# Patient Record
Sex: Female | Born: 1960 | Race: White | Hispanic: No | Marital: Married | Smoking: Never smoker
Health system: Southern US, Community
[De-identification: ages and names within clinical notes are randomized; demographics above are authoritative.]

## PROBLEM LIST (undated history)

## (undated) DIAGNOSIS — T4145XA Adverse effect of unspecified anesthetic, initial encounter: Secondary | ICD-10-CM

## (undated) DIAGNOSIS — Q459 Congenital malformation of digestive system, unspecified: Secondary | ICD-10-CM

## (undated) DIAGNOSIS — F4310 Post-traumatic stress disorder, unspecified: Secondary | ICD-10-CM

## (undated) DIAGNOSIS — C801 Malignant (primary) neoplasm, unspecified: Secondary | ICD-10-CM

## (undated) DIAGNOSIS — G8929 Other chronic pain: Secondary | ICD-10-CM

## (undated) DIAGNOSIS — N390 Urinary tract infection, site not specified: Secondary | ICD-10-CM

## (undated) DIAGNOSIS — K219 Gastro-esophageal reflux disease without esophagitis: Secondary | ICD-10-CM

## (undated) DIAGNOSIS — S129XXA Fracture of neck, unspecified, initial encounter: Secondary | ICD-10-CM

## (undated) DIAGNOSIS — F419 Anxiety disorder, unspecified: Secondary | ICD-10-CM

## (undated) DIAGNOSIS — K5900 Constipation, unspecified: Secondary | ICD-10-CM

## (undated) DIAGNOSIS — Z9884 Bariatric surgery status: Secondary | ICD-10-CM

## (undated) DIAGNOSIS — M1990 Unspecified osteoarthritis, unspecified site: Secondary | ICD-10-CM

## (undated) DIAGNOSIS — Z9889 Other specified postprocedural states: Secondary | ICD-10-CM

## (undated) DIAGNOSIS — J329 Chronic sinusitis, unspecified: Secondary | ICD-10-CM

## (undated) DIAGNOSIS — R112 Nausea with vomiting, unspecified: Secondary | ICD-10-CM

## (undated) DIAGNOSIS — J189 Pneumonia, unspecified organism: Secondary | ICD-10-CM

## (undated) DIAGNOSIS — N189 Chronic kidney disease, unspecified: Secondary | ICD-10-CM

## (undated) DIAGNOSIS — M797 Fibromyalgia: Secondary | ICD-10-CM

## (undated) DIAGNOSIS — T8859XA Other complications of anesthesia, initial encounter: Secondary | ICD-10-CM

## (undated) DIAGNOSIS — D649 Anemia, unspecified: Secondary | ICD-10-CM

## (undated) HISTORY — PX: ABDOMINAL HYSTERECTOMY: SHX81

---
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
## 1997-04-03 HISTORY — PX: ANKLE ARTHROSCOPY: SUR85

## 1998-04-03 HISTORY — PX: SINUS EXPLORATION: SHX5214

## 2000-06-14 ENCOUNTER — Encounter: Admission: RE | Admit: 2000-06-14 | Discharge: 2000-06-14 | Payer: Self-pay | Admitting: Otolaryngology

## 2000-06-14 ENCOUNTER — Encounter: Payer: Self-pay | Admitting: Otolaryngology

## 2001-05-09 ENCOUNTER — Other Ambulatory Visit: Admission: RE | Admit: 2001-05-09 | Discharge: 2001-05-09 | Payer: Self-pay | Admitting: Obstetrics & Gynecology

## 2002-07-04 ENCOUNTER — Other Ambulatory Visit: Admission: RE | Admit: 2002-07-04 | Discharge: 2002-07-04 | Payer: Self-pay | Admitting: Obstetrics & Gynecology

## 2004-02-22 ENCOUNTER — Other Ambulatory Visit: Admission: RE | Admit: 2004-02-22 | Discharge: 2004-02-22 | Payer: Self-pay | Admitting: Obstetrics & Gynecology

## 2004-04-03 DIAGNOSIS — Z9884 Bariatric surgery status: Secondary | ICD-10-CM

## 2004-04-03 HISTORY — DX: Bariatric surgery status: Z98.84

## 2004-04-03 HISTORY — PX: OTHER SURGICAL HISTORY: SHX169

## 2004-05-16 ENCOUNTER — Ambulatory Visit: Payer: Self-pay | Admitting: Cardiology

## 2004-05-25 ENCOUNTER — Ambulatory Visit: Payer: Self-pay

## 2004-06-03 ENCOUNTER — Ambulatory Visit: Payer: Self-pay

## 2004-06-06 ENCOUNTER — Ambulatory Visit: Payer: Self-pay | Admitting: Cardiology

## 2005-04-03 HISTORY — PX: APPENDECTOMY: SHX54

## 2005-04-03 HISTORY — PX: CHOLECYSTECTOMY: SHX55

## 2006-10-24 ENCOUNTER — Inpatient Hospital Stay (HOSPITAL_COMMUNITY): Admission: AD | Admit: 2006-10-24 | Discharge: 2006-10-24 | Payer: Self-pay | Admitting: Obstetrics and Gynecology

## 2006-10-24 ENCOUNTER — Encounter (INDEPENDENT_AMBULATORY_CARE_PROVIDER_SITE_OTHER): Payer: Self-pay | Admitting: Obstetrics & Gynecology

## 2006-10-24 ENCOUNTER — Ambulatory Visit (HOSPITAL_COMMUNITY): Admission: RE | Admit: 2006-10-24 | Discharge: 2006-10-24 | Payer: Self-pay | Admitting: Obstetrics & Gynecology

## 2008-04-03 DIAGNOSIS — Q459 Congenital malformation of digestive system, unspecified: Secondary | ICD-10-CM

## 2008-04-03 HISTORY — PX: OTHER SURGICAL HISTORY: SHX169

## 2008-04-03 HISTORY — DX: Congenital malformation of digestive system, unspecified: Q45.9

## 2009-04-03 DIAGNOSIS — S129XXA Fracture of neck, unspecified, initial encounter: Secondary | ICD-10-CM

## 2009-04-03 HISTORY — DX: Fracture of neck, unspecified, initial encounter: S12.9XXA

## 2010-08-16 NOTE — Op Note (Signed)
NAME:  Janet Potts, Janet Potts                ACCOUNT NO.:  [id number]   MEDICAL RECORD NO.:  [id number]          PATIENT TYPE:  AMB   LOCATION:  SDC                           FACILITY:  [facility]   PHYSICIAN:  Freddy Finner, M.D.   DATE OF BIRTH:  [birth year]   DATE OF PROCEDURE:  10/24/2006  DATE OF DISCHARGE:                               OPERATIVE REPORT   PREOPERATIVE DIAGNOSES:  1. Complex right ovarian cyst.  2. Simple left ovarian cyst.  3. History of chronic pelvic pain.   POSTOPERATIVE DIAGNOSIS:  Simple-appearing multiple follicular type  cysts of both ovaries.   OPERATION PERFORMED:  1. Laparoscopy.  2. Bilateral salpingo-oophorectomy.   SURGEON:  Freddy Finner, M.D.   ESTIMATED BLOOD LOSS:  The estimated intraoperative blood loss was 50  mL.   ANESTHESIA:  The anesthesia was general endotracheal.   COMPLICATIONS:  Intraoperative complications were ultimately none;  however, with confusing early insufflation of the preperitoneal space,  which confused the operative findings initially, but this was eventually  resolved.   ADDITIONAL COMPLICATIONS:  Other intraoperative complications were none.   INDICATIONS FOR THE SURGERY:  The patient is a 50 year old with a long  history of chronic pelvic pain who has been followed for a complex right  adnexal cystic mass, possibly consistent with endometrioma.  She has  elected to proceed with bilateral salpingo-oophorectomy.  She has been  able to tolerate hormone replacement therapy.   DESCRIPTION OF THE OPERATION:  The patient was admitted on the morning  of surgery.  She was brought to the operating room and placed  under  adequate general endotracheal anesthesia. The patient was placed in the  dorsal lithotomy position using the [facility] stirrup system.  Betadine prep  of the abdomen was carried out in the usual fashion.  The  bladder was  evacuated with a sterile catheter using a sterile technique.   Sponge forceps with the sponge  at the tip was left in the vaginal canal  for potential use during the procedure for anatomical identification and  traction.  An infraumbilical skin incision was made and through this an  11 mm bladder disposable trocar was placed.   Direct inspection revealed what was thought to initially be adequate  placement within the peritoneal cavity with numerous adhesions, but with  no evidence of bowel injury or other injury on entry.  A  pneumoperitoneum or what was thought to be a pneumoperitoneum was  allowed to accumulate.  Repeated attempts to visualize the pelvic  contents were unsuccessful even though the vaginal distention with the  sponge forceps was easily seen through a transitional layer thought to  be adhesions.   At this point after consultation with the husband it was elected to  terminate the procedure and discuss a laparotomy at a future date with  his wife.   Upon removal of the trocars the pneumoperitoneum did not disperse.  Replacement of the trocar actually then entered the peritoneal cavity  and it was obvious that the initial gas insufflation was in the  appropriate __________  space.  This allowed  insufflation of the  abdominal cavity, which revealed essentially no pelvic adhesions.  Scanning inspection of the upper abdomen revealed no advanced  abnormalities.  This allowed me to proceed with the bilateral salpingo-  oophorectomy as initially planned.  This was done using a 11 mm trocar  through a suprapubic incision, using grasping forceps to elevate the  tubes and ovaries, and a blunt probe and allow the Nezhat system through  this lower trocar sleeve.  The infundibulopelvic ligament and remaining  attachments to the ovary and round ligament to the lateral pelvic  sidewall were progressively sealed and divided using the [facility] tripolar  device.   The ovary was morcellated when it was observed to be completely benign  in external appearance and clear fluid filled  from essentially the site  that was entered.  Using large grasping forceps, the claw, through the  11 mm lower port the right ovary was easily extracted through the  incision by pulling the ovary into the trocar and removing the trocar.  The trocar was then replaced.  The left ovary was treated essentially  identically and retrieved in an essentially identical way.   The Nezhat system was then used to irrigate the pelvis and hemostasis  was noted to be complete.  Irritating solution was aspirated from the  pelvis.  Inspection with the reduced intra-abdominal pressure revealed  adequate hemostasis.  The instruments were removed.  The incisions were  closed and anesthetized with Vicryl using a quarter percent Marcaine.  Deep sutures of 3-0 Dexon were used to closed fascial incision at the  umbilicus and the smaller one in the suprapubic area was not closed.  The skin incisions were then closed with interrupted subcuticular  sutures of 3-0 Dexon.  Steri-strips were applied to the lower incision.   The patient was awakened and taken to recovery in good condition.   DISCHARGE INSTRUCTIONS:  The patient will be given routine outpatient  surgical instructions.      Freddy Finner, M.D.  Electronically Signed     WRN/MEDQ  D:  10/24/2006  T:  10/25/2006  Job:  [id number]

## 2011-01-16 LAB — CBC
HCT: 42
Hemoglobin: 14.3
MCHC: 34.1
MCV: 91.1
Platelets: 311
RBC: 4.61
RDW: 13.7
WBC: 11.7 — ABNORMAL HIGH

## 2011-01-16 LAB — PROTIME-INR
INR: 0.9
Prothrombin Time: 12.4

## 2011-01-16 LAB — APTT: aPTT: 28

## 2012-04-01 ENCOUNTER — Other Ambulatory Visit: Payer: Self-pay | Admitting: Neurosurgery

## 2012-04-01 DIAGNOSIS — M47812 Spondylosis without myelopathy or radiculopathy, cervical region: Secondary | ICD-10-CM

## 2012-04-02 ENCOUNTER — Ambulatory Visit
Admission: RE | Admit: 2012-04-02 | Discharge: 2012-04-02 | Disposition: A | Discharge status: Home / Self Care | Payer: 59 | Source: Ambulatory Visit | Attending: Neurosurgery | Admitting: Neurosurgery

## 2012-04-02 DIAGNOSIS — M47812 Spondylosis without myelopathy or radiculopathy, cervical region: Secondary | ICD-10-CM

## 2012-04-03 HISTORY — PX: ANKLE SURGERY: SHX546

## 2012-04-25 ENCOUNTER — Other Ambulatory Visit: Payer: Self-pay | Admitting: Neurosurgery

## 2012-05-24 ENCOUNTER — Encounter (HOSPITAL_COMMUNITY): Payer: Self-pay | Admitting: Respiratory Therapy

## 2012-05-24 ENCOUNTER — Encounter (HOSPITAL_COMMUNITY)
Admission: RE | Admit: 2012-05-24 | Discharge: 2012-05-24 | Disposition: A | Discharge status: Home / Self Care | Payer: 59 | Source: Ambulatory Visit | Attending: Neurosurgery | Admitting: Neurosurgery

## 2012-05-24 ENCOUNTER — Encounter (HOSPITAL_COMMUNITY): Payer: Self-pay

## 2012-05-24 HISTORY — DX: Other complications of anesthesia, initial encounter: T88.59XA

## 2012-05-24 HISTORY — DX: Pneumonia, unspecified organism: J18.9

## 2012-05-24 HISTORY — DX: Other specified postprocedural states: Z98.890

## 2012-05-24 HISTORY — DX: Adverse effect of unspecified anesthetic, initial encounter: T41.45XA

## 2012-05-24 HISTORY — DX: Urinary tract infection, site not specified: N39.0

## 2012-05-24 HISTORY — DX: Fibromyalgia: M79.7

## 2012-05-24 HISTORY — DX: Congenital malformation of digestive system, unspecified: Q45.9

## 2012-05-24 HISTORY — DX: Anxiety disorder, unspecified: F41.9

## 2012-05-24 HISTORY — DX: Other specified postprocedural states: R11.2

## 2012-05-24 HISTORY — DX: Gastro-esophageal reflux disease without esophagitis: K21.9

## 2012-05-24 HISTORY — DX: Malignant (primary) neoplasm, unspecified: C80.1

## 2012-05-24 HISTORY — DX: Bariatric surgery status: Z98.84

## 2012-05-24 LAB — BASIC METABOLIC PANEL
CO2: 29 mEq/L (ref 19–32)
Calcium: 9.8 mg/dL (ref 8.4–10.5)
Potassium: 4.1 mEq/L (ref 3.5–5.1)
Sodium: 139 mEq/L (ref 135–145)

## 2012-05-24 LAB — SURGICAL PCR SCREEN: Staphylococcus aureus: NEGATIVE

## 2012-05-24 NOTE — Pre-Procedure Instructions (Addendum)
Janet Potts  05/24/2012   Your procedure is scheduled on:05/31/12  Report to [facility] at530 AM.  Call this number if you have problems the morning of surgery: [phone]   Remember:   Do not eat food or drink liquids after midnight.   Take these medicines the morning of surgery with A SIP OF WATER: pain med , zofran, xanax  STOP ibuprofen   Do not wear jewelry, make-up or nail polish.  Do not wear lotions, powders, or perfumes. You may wear deodorant.  Do not shave 48 hours prior to surgery. Men may shave face and neck.  Do not bring valuables to the hospital.  Contacts, dentures or bridgework may not be worn into surgery.  Leave suitcase in the car. After surgery it may be brought to your room.  For patients admitted to the hospital, checkout time is 11:00 AM the day of  discharge.   Patients discharged the day of surgery will not be allowed to drive  home.  Name and phone number of your driver:  Special Instructions: Shower using CHG 2 nights before surgery and the night before surgery.  If you shower the day of surgery use CHG.  Use special wash - you have one bottle of CHG for all showers.  You should use approximately 1/3 of the bottle for each shower.   Please read over the following fact sheets that you were given: Pain Booklet, Coughing and Deep Breathing, MRSA Information and Surgical Site Infection Prevention

## 2012-05-24 NOTE — Progress Notes (Signed)
Patient very anxious. Past hx of tramatic surgeries. 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

## 2012-05-27 NOTE — H&P (Signed)
Was unable to locate stress test or echo from [facility] OR [facility].

## 2012-05-30 MED ORDER — CEFAZOLIN SODIUM-DEXTROSE 2-3 GM-% IV SOLR
2.0000 g | INTRAVENOUS | Status: AC
  Administered 2012-05-31: 2 g via INTRAVENOUS
  Filled 2012-05-30: qty 50

## 2012-05-31 ENCOUNTER — Ambulatory Visit (HOSPITAL_COMMUNITY): Payer: 59

## 2012-05-31 ENCOUNTER — Inpatient Hospital Stay (HOSPITAL_COMMUNITY)
Admission: RE | Admit: 2012-05-31 | Discharge: 2012-05-31 | DRG: 490 | Disposition: A | Discharge status: Home / Self Care | Payer: 59 | Source: Ambulatory Visit | Attending: Neurosurgery | Admitting: Neurosurgery

## 2012-05-31 ENCOUNTER — Encounter (HOSPITAL_COMMUNITY): Payer: Self-pay | Admitting: Anesthesiology

## 2012-05-31 ENCOUNTER — Encounter (HOSPITAL_COMMUNITY): Payer: Self-pay | Admitting: *Deleted

## 2012-05-31 ENCOUNTER — Ambulatory Visit (HOSPITAL_COMMUNITY): Payer: 59 | Admitting: Anesthesiology

## 2012-05-31 ENCOUNTER — Encounter (HOSPITAL_COMMUNITY)
Admission: RE | Disposition: A | Discharge status: Home / Self Care | Payer: Self-pay | Source: Ambulatory Visit | Attending: Neurosurgery

## 2012-05-31 DIAGNOSIS — Z9884 Bariatric surgery status: Secondary | ICD-10-CM

## 2012-05-31 DIAGNOSIS — Z01812 Encounter for preprocedural laboratory examination: Secondary | ICD-10-CM

## 2012-05-31 DIAGNOSIS — Z85828 Personal history of other malignant neoplasm of skin: Secondary | ICD-10-CM

## 2012-05-31 DIAGNOSIS — F411 Generalized anxiety disorder: Secondary | ICD-10-CM | POA: Diagnosis present

## 2012-05-31 DIAGNOSIS — Z79899 Other long term (current) drug therapy: Secondary | ICD-10-CM

## 2012-05-31 DIAGNOSIS — M5000 Cervical disc disorder with myelopathy, unspecified cervical region: Principal | ICD-10-CM | POA: Diagnosis present

## 2012-05-31 DIAGNOSIS — M5020 Other cervical disc displacement, unspecified cervical region: Secondary | ICD-10-CM | POA: Diagnosis present

## 2012-05-31 HISTORY — PX: CERVICAL DISC ARTHROPLASTY: SHX587

## 2012-05-31 LAB — CBC WITH DIFFERENTIAL/PLATELET
Eosinophils Relative: 1 % (ref 0–5)
Hemoglobin: 13 g/dL (ref 12.0–15.0)
Lymphocytes Relative: 37 % (ref 12–46)
Lymphs Abs: 2.7 10*3/uL (ref 0.7–4.0)
MCV: 89.8 fL (ref 78.0–100.0)
Neutrophils Relative %: 51 % (ref 43–77)
Platelets: 243 10*3/uL (ref 150–400)
RBC: 4.21 MIL/uL (ref 3.87–5.11)
WBC: 7.2 10*3/uL (ref 4.0–10.5)

## 2012-05-31 SURGERY — CERVICAL ANTERIOR DISC ARTHROPLASTY
Anesthesia: General | Site: Spine Cervical | Wound class: Clean

## 2012-05-31 MED ORDER — EST ESTROGENS-METHYLTEST 1.25-2.5 MG PO TABS: 1.0000 | ORAL_TABLET | Freq: Every day | ORAL | Status: DC

## 2012-05-31 MED ORDER — SODIUM CHLORIDE 0.9 % IJ SOLN: 3.0000 mL | INTRAMUSCULAR | Status: DC | PRN

## 2012-05-31 MED ORDER — HYDROMORPHONE HCL PF 1 MG/ML IJ SOLN
INTRAMUSCULAR | Status: AC
  Filled 2012-05-31: qty 1

## 2012-05-31 MED ORDER — CEFAZOLIN SODIUM 1-5 GM-% IV SOLN
1.0000 g | Freq: Three times a day (TID) | INTRAVENOUS | Status: DC
  Administered 2012-05-31: 1 g via INTRAVENOUS
  Filled 2012-05-31 (×2): qty 50

## 2012-05-31 MED ORDER — HYDROCODONE-ACETAMINOPHEN 5-325 MG PO TABS: 1.0000 | ORAL_TABLET | ORAL | Status: DC | PRN

## 2012-05-31 MED ORDER — KETOROLAC TROMETHAMINE 30 MG/ML IJ SOLN
30.0000 mg | Freq: Four times a day (QID) | INTRAMUSCULAR | Status: DC
  Administered 2012-05-31: 30 mg via INTRAVENOUS

## 2012-05-31 MED ORDER — NEOSTIGMINE METHYLSULFATE 1 MG/ML IJ SOLN
INTRAMUSCULAR | Status: DC | PRN
  Administered 2012-05-31: 3 mg via INTRAVENOUS

## 2012-05-31 MED ORDER — ROCURONIUM BROMIDE 100 MG/10ML IV SOLN
INTRAVENOUS | Status: DC | PRN
  Administered 2012-05-31: 50 mg via INTRAVENOUS

## 2012-05-31 MED ORDER — HEMOSTATIC AGENTS (NO CHARGE) OPTIME
TOPICAL | Status: DC | PRN
  Administered 2012-05-31: 1 via TOPICAL

## 2012-05-31 MED ORDER — OXYCODONE-ACETAMINOPHEN 5-325 MG PO TABS
1.0000 | ORAL_TABLET | ORAL | Status: DC | PRN
  Administered 2012-05-31 (×2): 2 via ORAL
  Filled 2012-05-31 (×2): qty 2

## 2012-05-31 MED ORDER — CYCLOBENZAPRINE HCL 10 MG PO TABS
10.0000 mg | ORAL_TABLET | Freq: Three times a day (TID) | ORAL | Status: DC | PRN
  Administered 2012-05-31: 10 mg via ORAL

## 2012-05-31 MED ORDER — ZOLPIDEM TARTRATE 5 MG PO TABS: 5.0000 mg | ORAL_TABLET | Freq: Every evening | ORAL | Status: DC | PRN

## 2012-05-31 MED ORDER — LIDOCAINE HCL (CARDIAC) 20 MG/ML IV SOLN
INTRAVENOUS | Status: DC | PRN
  Administered 2012-05-31: 80 mg via INTRAVENOUS

## 2012-05-31 MED ORDER — DEXAMETHASONE SODIUM PHOSPHATE 10 MG/ML IJ SOLN: 10.0000 mg | INTRAMUSCULAR | Status: DC

## 2012-05-31 MED ORDER — DEXAMETHASONE SODIUM PHOSPHATE 10 MG/ML IJ SOLN
INTRAMUSCULAR | Status: AC
  Administered 2012-05-31: 10 mg via INTRAVENOUS
  Filled 2012-05-31: qty 1

## 2012-05-31 MED ORDER — SODIUM CHLORIDE 0.9 % IJ SOLN
3.0000 mL | Freq: Two times a day (BID) | INTRAMUSCULAR | Status: DC
  Administered 2012-05-31: 3 mL via INTRAVENOUS

## 2012-05-31 MED ORDER — ALUM & MAG HYDROXIDE-SIMETH 200-200-20 MG/5ML PO SUSP: 30.0000 mL | Freq: Four times a day (QID) | ORAL | Status: DC | PRN

## 2012-05-31 MED ORDER — ALPRAZOLAM 0.5 MG PO TABS: 0.5000 mg | ORAL_TABLET | Freq: Every day | ORAL | Status: DC | PRN

## 2012-05-31 MED ORDER — SODIUM CHLORIDE 0.9 % IV SOLN
INTRAVENOUS | Status: AC
  Filled 2012-05-31: qty 500

## 2012-05-31 MED ORDER — HYDROMORPHONE HCL PF 1 MG/ML IJ SOLN
0.2500 mg | INTRAMUSCULAR | Status: DC | PRN
  Administered 2012-05-31 (×4): 0.5 mg via INTRAVENOUS

## 2012-05-31 MED ORDER — THROMBIN 5000 UNITS EX SOLR
CUTANEOUS | Status: DC | PRN
  Administered 2012-05-31 (×2): 5000 [IU] via TOPICAL

## 2012-05-31 MED ORDER — ACETAMINOPHEN 650 MG RE SUPP: 650.0000 mg | RECTAL | Status: DC | PRN

## 2012-05-31 MED ORDER — GLYCOPYRROLATE 0.2 MG/ML IJ SOLN
INTRAMUSCULAR | Status: DC | PRN
  Administered 2012-05-31: 0.4 mg via INTRAVENOUS

## 2012-05-31 MED ORDER — SODIUM CHLORIDE 0.9 % IR SOLN
Status: DC | PRN
  Administered 2012-05-31: 08:00:00

## 2012-05-31 MED ORDER — 0.9 % SODIUM CHLORIDE (POUR BTL) OPTIME
TOPICAL | Status: DC | PRN
  Administered 2012-05-31: 1000 mL

## 2012-05-31 MED ORDER — MENTHOL 3 MG MT LOZG
1.0000 | LOZENGE | OROMUCOSAL | Status: DC | PRN
  Filled 2012-05-31: qty 9

## 2012-05-31 MED ORDER — LACTATED RINGERS IV SOLN
INTRAVENOUS | Status: DC | PRN
  Administered 2012-05-31 (×2): via INTRAVENOUS

## 2012-05-31 MED ORDER — ONDANSETRON HCL 4 MG/2ML IJ SOLN: 4.0000 mg | INTRAMUSCULAR | Status: DC | PRN

## 2012-05-31 MED ORDER — ONDANSETRON HCL 4 MG/2ML IJ SOLN
INTRAMUSCULAR | Status: DC | PRN
  Administered 2012-05-31: 4 mg via INTRAVENOUS

## 2012-05-31 MED ORDER — KETOROLAC TROMETHAMINE 30 MG/ML IJ SOLN: 30.0000 mg | Freq: Once | INTRAMUSCULAR | Status: DC

## 2012-05-31 MED ORDER — HYDROMORPHONE HCL PF 1 MG/ML IJ SOLN
0.5000 mg | INTRAMUSCULAR | Status: DC | PRN
  Administered 2012-05-31: 1 mg via INTRAVENOUS
  Filled 2012-05-31: qty 1

## 2012-05-31 MED ORDER — SENNA 8.6 MG PO TABS
1.0000 | ORAL_TABLET | Freq: Two times a day (BID) | ORAL | Status: DC
  Administered 2012-05-31: 8.6 mg via ORAL

## 2012-05-31 MED ORDER — CYCLOBENZAPRINE HCL 10 MG PO TABS
ORAL_TABLET | ORAL | Status: AC
  Filled 2012-05-31: qty 1

## 2012-05-31 MED ORDER — FENTANYL CITRATE 0.05 MG/ML IJ SOLN
INTRAMUSCULAR | Status: DC | PRN
  Administered 2012-05-31 (×4): 100 ug via INTRAVENOUS
  Administered 2012-05-31: 50 ug via INTRAVENOUS

## 2012-05-31 MED ORDER — ORPHENADRINE CITRATE ER 100 MG PO TB12
100.0000 mg | ORAL_TABLET | Freq: Two times a day (BID) | ORAL | Status: DC | PRN
  Filled 2012-05-31: qty 1

## 2012-05-31 MED ORDER — ACETAMINOPHEN 325 MG PO TABS: 650.0000 mg | ORAL_TABLET | ORAL | Status: DC | PRN

## 2012-05-31 MED ORDER — OXYCODONE-ACETAMINOPHEN 5-325 MG PO TABS: 1.0000 | ORAL_TABLET | ORAL | Status: DC | PRN

## 2012-05-31 MED ORDER — PROPOFOL 10 MG/ML IV BOLUS
INTRAVENOUS | Status: DC | PRN
  Administered 2012-05-31: 170 mg via INTRAVENOUS

## 2012-05-31 MED ORDER — MIDAZOLAM HCL 5 MG/5ML IJ SOLN
INTRAMUSCULAR | Status: DC | PRN
  Administered 2012-05-31: 2 mg via INTRAVENOUS

## 2012-05-31 MED ORDER — BACITRACIN 50000 UNITS IM SOLR
INTRAMUSCULAR | Status: AC
  Filled 2012-05-31: qty 1

## 2012-05-31 MED ORDER — KETOROLAC TROMETHAMINE 30 MG/ML IJ SOLN
INTRAMUSCULAR | Status: AC
  Filled 2012-05-31: qty 1

## 2012-05-31 MED ORDER — PHENOL 1.4 % MT LIQD: 1.0000 | OROMUCOSAL | Status: DC | PRN

## 2012-05-31 SURGICAL SUPPLY — 52 items
APL SKNCLS STERI-STRIP NONHPOA (GAUZE/BANDAGES/DRESSINGS) ×1
BAG DECANTER FOR FLEXI CONT (MISCELLANEOUS) ×2 IMPLANT
BENZOIN TINCTURE PRP APPL 2/3 (GAUZE/BANDAGES/DRESSINGS) ×2 IMPLANT
BRUSH SCRUB EZ PLAIN DRY (MISCELLANEOUS) ×2 IMPLANT
BUR MATCHSTICK NEURO 3.0 LAGG (BURR) ×2 IMPLANT
CANISTER SUCTION 2500CC (MISCELLANEOUS) ×2 IMPLANT
CLOTH BEACON ORANGE TIMEOUT ST (SAFETY) ×2 IMPLANT
CONT SPEC 4OZ CLIKSEAL STRL BL (MISCELLANEOUS) ×2 IMPLANT
CUTTER 14MM (INSTRUMENTS) ×1 IMPLANT
DISC CERVICAL BRYAN 14MM (Neuro Prosthesis/Implant) ×1 IMPLANT
DRAPE C-ARM 42X72 X-RAY (DRAPES) ×4 IMPLANT
DRAPE LAPAROTOMY 100X72 PEDS (DRAPES) ×2 IMPLANT
DRAPE MICROSCOPE ZEISS OPMI (DRAPES) ×2 IMPLANT
DRAPE POUCH INSTRU U-SHP 10X18 (DRAPES) ×2 IMPLANT
ELECT COATED BLADE 2.86 ST (ELECTRODE) ×2 IMPLANT
ELECT REM PT RETURN 9FT ADLT (ELECTROSURGICAL) ×2
ELECTRODE REM PT RTRN 9FT ADLT (ELECTROSURGICAL) ×1 IMPLANT
GAUZE SPONGE 4X4 16PLY XRAY LF (GAUZE/BANDAGES/DRESSINGS) IMPLANT
GLOVE BIOGEL PI IND STRL 7.0 (GLOVE) IMPLANT
GLOVE BIOGEL PI IND STRL 8 (GLOVE) IMPLANT
GLOVE BIOGEL PI INDICATOR 7.0 (GLOVE) ×1
GLOVE BIOGEL PI INDICATOR 8 (GLOVE) ×1
GLOVE ECLIPSE 8.5 STRL (GLOVE) ×2 IMPLANT
GLOVE EXAM NITRILE LRG STRL (GLOVE) IMPLANT
GLOVE EXAM NITRILE MD LF STRL (GLOVE) IMPLANT
GLOVE EXAM NITRILE XL STR (GLOVE) IMPLANT
GLOVE EXAM NITRILE XS STR PU (GLOVE) IMPLANT
GLOVE SURG SS PI 7.0 STRL IVOR (GLOVE) ×5 IMPLANT
GOWN BRE IMP SLV AUR LG STRL (GOWN DISPOSABLE) IMPLANT
GOWN BRE IMP SLV AUR XL STRL (GOWN DISPOSABLE) IMPLANT
GOWN STRL REIN 2XL LVL4 (GOWN DISPOSABLE) IMPLANT
HEAD HALTER (SOFTGOODS) ×2 IMPLANT
KIT BASIN OR (CUSTOM PROCEDURE TRAY) ×2 IMPLANT
KIT ROOM TURNOVER OR (KITS) ×2 IMPLANT
NDL SPNL 20GX3.5 QUINCKE YW (NEEDLE) ×1 IMPLANT
NEEDLE SPNL 20GX3.5 QUINCKE YW (NEEDLE) ×2 IMPLANT
NS IRRIG 1000ML POUR BTL (IV SOLUTION) ×2 IMPLANT
PACK LAMINECTOMY NEURO (CUSTOM PROCEDURE TRAY) ×2 IMPLANT
PAD ARMBOARD 7.5X6 YLW CONV (MISCELLANEOUS) ×6 IMPLANT
RUBBERBAND STERILE (MISCELLANEOUS) ×4 IMPLANT
SPONGE GAUZE 4X4 12PLY (GAUZE/BANDAGES/DRESSINGS) ×2 IMPLANT
SPONGE INTESTINAL PEANUT (DISPOSABLE) ×2 IMPLANT
SPONGE SURGIFOAM ABS GEL SZ50 (HEMOSTASIS) ×2 IMPLANT
STRIP CLOSURE SKIN 1/2X4 (GAUZE/BANDAGES/DRESSINGS) ×2 IMPLANT
SUT PDS AB 5-0 P3 18 (SUTURE) ×2 IMPLANT
SUT VIC AB 3-0 SH 8-18 (SUTURE) ×2 IMPLANT
SYR 20ML ECCENTRIC (SYRINGE) ×2 IMPLANT
TAPE CLOTH 4X10 WHT NS (GAUZE/BANDAGES/DRESSINGS) ×2 IMPLANT
TAPE STRIPS DRAPE STRL (GAUZE/BANDAGES/DRESSINGS) ×1 IMPLANT
TOWEL OR 17X24 6PK STRL BLUE (TOWEL DISPOSABLE) ×2 IMPLANT
TOWEL OR 17X26 10 PK STRL BLUE (TOWEL DISPOSABLE) ×2 IMPLANT
WATER STERILE IRR 1000ML POUR (IV SOLUTION) ×2 IMPLANT

## 2012-05-31 NOTE — Op Note (Signed)
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  Asst. Surgeon: Jeral Fruit  Anesthesia: General  Indication: 52 year old female with chronic neck pain headache and some radiation into her shoulders failing all conservative management presents with a fairly large paracentral disc herniation and some associated spondylosis at C5-6 causing spinal cord compression. Remainder of her cervical spine is normal. I discussed options of treatment including both operative and of care. Patient said proceed with C4-5 anterior cervical discectomy with interbody arthroplasty in hopes of improving her symptoms.  Operative note: After induction anesthesia, patient positioned supine with Extended and held in place with halter traction. Anterior cervical region prepped and draped. Incision overlying the C4-5 interspace. This carried down sharply to the platysma. Platysma divided vertically and dissection proceeded along the medial border of the sternomastoid muscle and carotid sheath. Trachea and esophagus retracted towards the left. Prevertebral fascia stripped within her spinal column. Longus colli muscles elevated bilaterally using electrocautery. Deep self retaining retractors placed intraoperative fluoroscopy is used levels confirmed. The space and size 15 blade in a rectangular fashion. Y. disc space cleanout was achieved using pituitary rongeurs forward and backward angle Carlen curettes Kerrison rongeurs and a high-speed drill. All elements the disc removed down to level of the posterior annulus. Microscope was then brought into the field and used throughout the remainder of the discectomy. Remaining aspects of annulus and osteophytes  removed using high-speed drill down to the level of the posterior longitudinal ligament. Posterior lateral and was elevated and resected in piecemeal fashion using Kerrison rongeurs. Underlying thecal sac was identified. A wide central decompression and perform undercutting the bodies of C4 and C5. Decompression MCH are of foramen. Anterior foraminotomies were then performed on course exiting C5 nerve roots bilaterally. At this point a very thorough decompression achieved. Attention was then placed towards interbody arthroplasty. Distractor pins were placed in the vertebral bodies of C4 and C5 and orthogonal fashion. The disc spaces distracted open. It was measured and sized and a 14 mm implant was found to be the appropriate size for this patient. The space was prepared. The cutting drill was then inserted and the interspace and concavity is were then made into the vertebral bodies of C4 and C5. The 14 mm Bryan disc was then prepared and packed into place within the disc space. This countersunk well without difficulty or abnormality. Images real good position of the implant at the proper upper level with normal alignment of the spine. Wound is then irrigated one final time and then closed in a typical fashion. Steri-Strips and sterile dressing were applied. There were no apparent complications. Patient tolerated the procedure well and she returns recovery postop.

## 2012-05-31 NOTE — Anesthesia Postprocedure Evaluation (Signed)
  Anesthesia Post-op Note  Patient: Janet Potts  Procedure(s) Performed: Procedure(s) with comments: CERVICAL ANTERIOR DISC ARTHROPLASTY (N/A) - C-Arm  Patient Location: PACU  Anesthesia Type:General  Level of Consciousness: awake  Airway and Oxygen Therapy: Patient Spontanous Breathing  Post-op Pain: mild  Post-op Assessment: Post-op Vital signs reviewed  Post-op Vital Signs: Reviewed  Complications: No apparent anesthesia complications

## 2012-05-31 NOTE — Brief Op Note (Signed)
05/31/2012  8:50 AM  PATIENT:  Janet Potts  52 y.o. female  PRE-OPERATIVE DIAGNOSIS:  DDD  POST-OPERATIVE DIAGNOSIS:  degenerative dsic disease  PROCEDURE:  Procedure(s) with comments: CERVICAL ANTERIOR DISC ARTHROPLASTY (N/A) - C-Arm  SURGEON:  Surgeon(s) and Role:    * Temple Pacini, MD - Primary    * Karn Cassis, MD - Assisting  PHYSICIAN ASSISTANT:   ASSISTANTS:    ANESTHESIA:   General  EBL:  Total I/O In: 1000 [I.V.:1000] Out: -   BLOOD ADMINISTERED:none  DRAINS: none   LOCAL MEDICATIONS USED:  NONE  SPECIMEN:  No Specimen  DISPOSITION OF SPECIMEN:  N/A  COUNTS:  YES  TOURNIQUET:  * No tourniquets in log *  DICTATION: .Dragon Dictation  PLAN OF CARE: Admit to inpatient   PATIENT DISPOSITION:  PACU - hemodynamically stable.   Delay start of Pharmacological VTE agent (>24hrs) due to surgical blood loss or risk of bleeding: yes

## 2012-05-31 NOTE — Progress Notes (Signed)
Utilization review completed. Lylian Sanagustin, RN, BSN. 

## 2012-05-31 NOTE — Discharge Summary (Signed)
Physician Discharge Summary  Patient ID: Savannah Morford MRN: [id number] DOB/AGE: 1960-07-10 52 y.o.  Admit date: 05/31/2012 Discharge date: 05/31/2012  Admission Diagnoses:  Discharge Diagnoses:  Principal Problem:   Herniation of cervical intervertebral disc   Discharged Condition: good  Hospital Course: Patient in the hospital where she underwent uncomplicated anterior cervical discectomy and interbody arthroplasty. Postoperatively she did well without new problems. Ready for discharge home.  Consults:   Significant Diagnostic Studies:   Treatments:   Discharge Exam: Blood pressure 111/71, pulse 57, temperature 98.5 F (36.9 C), resp. rate 18, SpO2 93.00%. Awake and alert oriented and appropriate Percocet nerve function is intact. Motor and sensory function of the extremities is normal. Wound clean and dry. Next soft chest and abdomen benign.  Disposition: Final discharge disposition not confirmed     Medication List    TAKE these medications       ALPRAZolam 0.5 MG tablet  Commonly known as:  XANAX  Take 0.5 mg by mouth daily as needed for anxiety.     Calcium 1200-1000 MG-UNIT Chew  Chew 1 tablet by mouth daily.     CALCIUM PO  Take 1 tablet by mouth daily.     estrogen-methylTESTOSTERone 1.25-2.5 MG per tablet  Commonly known as:  ESTRATEST  Take 1 tablet by mouth daily.     GAS-X PO  Take 180 mg by mouth daily as needed.     HYDROcodone-acetaminophen 7.5-500 MG per tablet  Commonly known as:  LORTAB  Take 1 tablet by mouth every 6 (six) hours as needed for pain.     ibuprofen 200 MG tablet  Commonly known as:  ADVIL,MOTRIN  Take 400 mg by mouth every 6 (six) hours as needed for pain.     KRILL OIL OMEGA-3 PO  Take 1 tablet by mouth daily.     magnesium chloride 64 MG Tbec  Commonly known as:  SLOW-MAG  Take 1 tablet by mouth daily.     multivitamin with minerals Tabs  Take 1 tablet by mouth daily.     ondansetron 4 MG tablet  Commonly known  as:  ZOFRAN  Take 4 mg by mouth every 8 (eight) hours as needed for nausea.     orphenadrine 100 MG tablet  Commonly known as:  NORFLEX  Take 100 mg by mouth 2 (two) times daily as needed for muscle spasms.     OVER THE COUNTER MEDICATION  Take 1 tablet by mouth daily. Vita-Melts Energy Plus Vitamin b12 1500mg      oxyCODONE-acetaminophen 5-325 MG per tablet  Commonly known as:  PERCOCET/ROXICET  Take 1-2 tablets by mouth every 4 (four) hours as needed.     VITAMIN B-12 SL  Place 2,400 mcg under the tongue daily.     vitamin C 500 MG tablet  Commonly known as:  ASCORBIC ACID  Take 1,000 mg by mouth daily.           Follow-up Information   Follow up with Zyier Dykema A, MD. Call in 1 week. (Ask for Lurena Joiner)    Contact information:   [address] [place] [address] [phone]       Signed: Temple Pacini 05/31/2012, 6:58 PM

## 2012-05-31 NOTE — Anesthesia Procedure Notes (Signed)
Procedure Name: Intubation Date/Time: 05/31/2012 7:09 AM Performed by: Ferol Luz L Pre-anesthesia Checklist: Patient identified, Emergency Drugs available, Suction available, Patient being monitored and Timeout performed Patient Re-evaluated:Patient Re-evaluated prior to inductionOxygen Delivery Method: Circle system utilized Preoxygenation: Pre-oxygenation with 100% oxygen Intubation Type: IV induction and Cricoid Pressure applied Ventilation: Mask ventilation without difficulty Laryngoscope Size: Mac and 3 Grade View: Grade II Tube type: Oral Tube size: 7.5 mm Number of attempts: 1 Airway Equipment and Method: Stylet Placement Confirmation: ETT inserted through vocal cords under direct vision,  breath sounds checked- equal and bilateral and positive ETCO2 Secured at: 21 cm Tube secured with: Tape Dental Injury: Teeth and Oropharynx as per pre-operative assessment  Comments: Atraumatic neutral intubation

## 2012-05-31 NOTE — H&P (Signed)
Janet Potts is an 52 y.o. female.   Chief Complaint: Neck pain HPI: 52 year old female with history of previous trauma to the back of her neck. Patient with subsequent and long-standing neck pain with some radiation to her upper extremities and associated headaches failing all conservative management. Workup demonstrates evidence of a fairly large central disc herniation at C4-5 with compression of the spinal cord. There is also some degree of kyphotic angulation. The remainder of her cervical spine was quite healthy. Patient has a callus as her options and she is decided proceed with C4-5 anterior cervical discectomy with interbody arthroplasty in hopes of improving her symptoms.  Past Medical History  Diagnosis Date  . Complication of anesthesia   . PONV (postoperative nausea and vomiting)     occ  . 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 Gastric bypass status for obesity 06  . Anxiety     anxiety  . Pneumonia     20's  . UTI (urinary tract infection)     ? irritated  . GERD (gastroesophageal reflux disease)     hx before gastric bypass  . Cancer     skin  . Fibromyalgia     ?    Past Surgical History  Procedure Laterality Date  . Gastic  06    bypass  . Abdominal hysterectomy  98,08    partial, oophorectomy bil  . Tonsillectomy  74  . Ankle arthroscopy Right 99    cartilage  . Sinus exploration  00  . Cholecystectomy  07  . Appendectomy  07  . Gastricsurgery  10    repair intestinal rupture    History reviewed. No pertinent family history. Social History:  reports that she has never smoked. She does not have any smokeless tobacco history on file. She reports that she drinks about 1.1 ounces of alcohol per week. She reports that she does not use illicit drugs.  Allergies:  Allergies  Allergen Reactions  . Other     Silk tape  . Sulfa Antibiotics Swelling  . Adhesive (Tape) Hives, Swelling and  Rash  . Avelox (Moxifloxacin) Rash    Medications Prior to Admission  Medication Sig Dispense Refill  . Calcium 1200-1000 MG-UNIT CHEW Chew 1 tablet by mouth daily.      Marland Kitchen CALCIUM PO Take 1 tablet by mouth daily.      . Cyanocobalamin (VITAMIN B-12 SL) Place 2,400 mcg under the tongue daily.      Marland Kitchen estrogen-methylTESTOSTERone (ESTRATEST) 1.25-2.5 MG per tablet Take 1 tablet by mouth daily.      Marland Kitchen HYDROcodone-acetaminophen (LORTAB) 7.5-500 MG per tablet Take 1 tablet by mouth every 6 (six) hours as needed for pain.      Marland Kitchen KRILL OIL OMEGA-3 PO Take 1 tablet by mouth daily.      . magnesium chloride (SLOW-MAG) 64 MG TBEC Take 1 tablet by mouth daily.      . Multiple Vitamin (MULTIVITAMIN WITH MINERALS) TABS Take 1 tablet by mouth daily.      . orphenadrine (NORFLEX) 100 MG tablet Take 100 mg by mouth 2 (two) times daily as needed for muscle spasms.      Marland Kitchen OVER THE COUNTER MEDICATION Take 1 tablet by mouth daily. Vita-Melts Energy Plus Vitamin b12 1500mg       . Simethicone (GAS-X PO) Take 180 mg by mouth daily as needed.      . vitamin C (ASCORBIC ACID) 500 MG  tablet Take 1,000 mg by mouth daily.      Marland Kitchen ALPRAZolam (XANAX) 0.5 MG tablet Take 0.5 mg by mouth daily as needed for anxiety.       Marland Kitchen ibuprofen (ADVIL,MOTRIN) 200 MG tablet Take 400 mg by mouth every 6 (six) hours as needed for pain.      Marland Kitchen ondansetron (ZOFRAN) 4 MG tablet Take 4 mg by mouth every 8 (eight) hours as needed for nausea.        Results for orders placed during the hospital encounter of 05/31/12 (from the past 48 hour(s))  CBC WITH DIFFERENTIAL     Status: None   Collection Time    05/31/12  5:59 AM      Result Value Range   WBC 7.2  4.0 - 10.5 K/uL   RBC 4.21  3.87 - 5.11 MIL/uL   Hemoglobin 13.0  12.0 - 15.0 g/dL   HCT 16.1  09.6 - 04.5 %   MCV 89.8  78.0 - 100.0 fL   MCH 30.9  26.0 - 34.0 pg   MCHC 34.4  30.0 - 36.0 g/dL   RDW 40.9  81.1 - 91.4 %   Platelets 243  150 - 400 K/uL   Neutrophils Relative 51  43 -  77 %   Neutro Abs 3.7  1.7 - 7.7 K/uL   Lymphocytes Relative 37  12 - 46 %   Lymphs Abs 2.7  0.7 - 4.0 K/uL   Monocytes Relative 10  3 - 12 %   Monocytes Absolute 0.7  0.1 - 1.0 K/uL   Eosinophils Relative 1  0 - 5 %   Eosinophils Absolute 0.1  0.0 - 0.7 K/uL   Basophils Relative 1  0 - 1 %   Basophils Absolute 0.1  0.0 - 0.1 K/uL   No results found.  Review of Systems  Constitutional: Negative.   HENT: Negative.   Eyes: Negative.   Respiratory: Negative.   Cardiovascular: Negative.   Gastrointestinal: Negative.   Genitourinary: Negative.   Musculoskeletal: Negative.   Skin: Negative.   Neurological: Negative.   Endo/Heme/Allergies: Negative.   Psychiatric/Behavioral: Negative.     Blood pressure 124/89, pulse 49, temperature 98.2 F (36.8 C), resp. rate 16, SpO2 99.00%. Physical Exam  Constitutional: She is oriented to person, place, and time. She appears well-developed and well-nourished.  HENT:  Head: Normocephalic and atraumatic.  Right Ear: External ear normal.  Left Ear: External ear normal.  Nose: Nose normal.  Mouth/Throat: Oropharynx is clear and moist.  Eyes: Conjunctivae and EOM are normal. Pupils are equal, round, and reactive to light. Right eye exhibits no discharge. Left eye exhibits no discharge.  Neck: Normal range of motion. Neck supple. No tracheal deviation present. No thyromegaly present.  Cardiovascular: Normal rate, regular rhythm, normal heart sounds and intact distal pulses.  Exam reveals no friction rub.   No murmur heard. Respiratory: Effort normal and breath sounds normal. No respiratory distress. She has no wheezes.  GI: Soft. Bowel sounds are normal. She exhibits no distension. There is no tenderness.  Musculoskeletal: Normal range of motion. She exhibits no edema and no tenderness.  Neurological: She is alert and oriented to person, place, and time. She has normal reflexes. No cranial nerve deficit. Coordination normal.  Skin: Skin is warm  and dry. No rash noted. No erythema.  Psychiatric: She has a normal mood and affect. Her behavior is normal. Judgment and thought content normal.     Assessment/Plan C4-5 herniated nucleus pulposus  with myelopathy. C4-5 anterior cervical discectomy with interbody Brian disc arthroplasty.risks and benefits have been explained. Patient wishes to proceed.   Jalexis Breed A 05/31/2012, 7:23 AM

## 2012-05-31 NOTE — Plan of Care (Signed)
Problem: Consults Goal: Diagnosis - Spinal Surgery Outcome: Completed/Met Date Met:  05/31/12 Cervical Spine Fusion     

## 2012-05-31 NOTE — Anesthesia Preprocedure Evaluation (Addendum)
Anesthesia Evaluation  Patient identified by MRN, date of birth, ID band Patient awake    Reviewed: Allergy & Precautions, H&P , NPO status   History of Anesthesia Complications (+) PONV  Airway Mallampati: II      Dental   Pulmonary pneumonia -, resolved,  breath sounds clear to auscultation        Cardiovascular Exercise Tolerance: Good negative cardio ROS  Rhythm:Regular Rate:Normal     Neuro/Psych Anxiety  Neuromuscular disease    GI/Hepatic GERD-  Medicated,  Endo/Other    Renal/GU      Musculoskeletal  (+) Fibromyalgia -  Abdominal   Peds  Hematology   Anesthesia Other Findings   Reproductive/Obstetrics                          Anesthesia Physical Anesthesia Plan  ASA: II  Anesthesia Plan: General   Post-op Pain Management:    Induction: Intravenous  Airway Management Planned: Oral ETT  Additional Equipment:   Intra-op Plan:   Post-operative Plan: Extubation in OR  Informed Consent: I have reviewed the patients History and Physical, chart, labs and discussed the procedure including the risks, benefits and alternatives for the proposed anesthesia with the patient or authorized representative who has indicated his/her understanding and acceptance.   Dental advisory given  Plan Discussed with: CRNA, Anesthesiologist and Surgeon  Anesthesia Plan Comments:         Anesthesia Quick Evaluation

## 2012-05-31 NOTE — Transfer of Care (Signed)
Immediate Anesthesia Transfer of Care Note  Patient: Janet Potts  Procedure(s) Performed: Procedure(s) with comments: CERVICAL ANTERIOR DISC ARTHROPLASTY (N/A) - C-Arm  Patient Location: PACU  Anesthesia Type:General  Level of Consciousness: awake, alert , oriented and patient cooperative  Airway & Oxygen Therapy: Patient Spontanous Breathing and Patient connected to nasal cannula oxygen  Post-op Assessment: Report given to PACU RN, Post -op Vital signs reviewed and stable and Patient moving all extremities  Post vital signs: Reviewed and stable  Complications: No apparent anesthesia complications

## 2012-05-31 NOTE — Preoperative (Signed)
Beta Blockers   Reason not to administer Beta Blockers:Not Applicable 

## 2012-06-03 ENCOUNTER — Encounter (HOSPITAL_COMMUNITY): Payer: Self-pay | Admitting: Neurosurgery

## 2013-04-03 HISTORY — PX: OTHER SURGICAL HISTORY: SHX169

## 2013-07-03 NOTE — Patient Instructions (Addendum)
Janet Potts  07/03/2013   Your procedure is scheduled on:  07/15/13   Report to [facility] at    0930  AM.  Call this number if you have problems the morning of surgery: [phone]   Remember:   Do not eat food or drink liquids after midnight.   Take these medicines the morning of surgery with A SIP OF WATER:    Do not wear jewelry, make-up or nail polish.  Do not wear lotions, powders, or perfumes. You may wear deodorant.  Do not shave 48 hours prior to surgery. Men may shave face and neck.  Do not bring valuables to the hospital.  Contacts, dentures or bridgework may not be worn into surgery.  Leave suitcase in the car. After surgery it may be brought to your room.  For patients admitted to the hospital, checkout time is 11:00 AM the day of  discharge.   Patients discharged the day of surgery will not be allowed to drive  home.  Name and phone number of your driver:                   [facility] - Preparing for Surgery Before surgery, you can play an important role.  Because skin is not sterile, your skin needs to be as free of germs as possible.  You can reduce the number of germs on your skin by washing with CHG (chlorahexidine gluconate) soap before surgery.  CHG is an antiseptic cleaner which kills germs and bonds with the skin to continue killing germs even after washing. Please DO NOT use if you have an allergy to CHG or antibacterial soaps.  If your skin becomes reddened/irritated stop using the CHG and inform your nurse when you arrive at Short Stay. Do not shave (including legs and underarms) for at least 48 hours prior to the first CHG shower.  You may shave your face. Please follow these instructions carefully:  1.  Shower with CHG Soap the night before surgery and the  morning of Surgery.  2.  If you choose to wash your hair, wash your hair first as usual with your  normal  shampoo.  3.  After you shampoo, rinse your hair and body thoroughly to remove  the  shampoo.                           4.  Use CHG as you would any other liquid soap.  You can apply chg directly  to the skin and wash                       Gently with a scrungie or clean washcloth.  5.  Apply the CHG Soap to your body ONLY FROM THE NECK DOWN.   Do not use on open                           Wound or open sores. Avoid contact with eyes, ears mouth and genitals (private parts).                        Genitals (private parts) with your normal soap.             6.  Wash thoroughly, paying special attention to the area where your surgery  will be performed.  7.  Thoroughly rinse your body with  warm water from the neck down.  8.  DO NOT shower/wash with your normal soap after using and rinsing off  the CHG Soap.                9.  Pat yourself dry with a clean towel.            10.  Wear clean pajamas.            11.  Place clean sheets on your bed the night of your first shower and do not  sleep with pets. Day of Surgery : Do not apply any lotions/deodorants the morning of surgery.  Please wear clean clothes to the hospital/surgery center.  FAILURE TO FOLLOW THESE INSTRUCTIONS MAY RESULT IN THE CANCELLATION OF YOUR SURGERY PATIENT SIGNATURE_________________________________  NURSE SIGNATURE__________________________________  WHAT IS A BLOOD TRANSFUSION? Blood Transfusion Information  A transfusion is the replacement of blood or some of its parts. Blood is made up of multiple cells which provide different functions.  Red blood cells carry oxygen and are used for blood loss replacement.  White blood cells fight against infection.  Platelets control bleeding.  Plasma helps clot blood.  Other blood products are available for specialized needs, such as hemophilia or other clotting disorders. BEFORE THE TRANSFUSION  Who gives blood for transfusions?   Healthy volunteers who are fully evaluated to make sure their blood is safe. This is blood bank blood. Transfusion therapy  is the safest it has ever been in the practice of medicine. Before blood is taken from a donor, a complete history is taken to make sure that person has no history of diseases nor engages in risky social behavior (examples are intravenous drug use or sexual activity with multiple partners). The donor's travel history is screened to minimize risk of transmitting infections, such as malaria. The donated blood is tested for signs of infectious diseases, such as HIV and hepatitis. The blood is then tested to be sure it is compatible with you in order to minimize the chance of a transfusion reaction. If you or a relative donates blood, this is often done in anticipation of surgery and is not appropriate for emergency situations. It takes many days to process the donated blood. RISKS AND COMPLICATIONS Although transfusion therapy is very safe and saves many lives, the main dangers of transfusion include:   Getting an infectious disease.  Developing a transfusion reaction. This is an allergic reaction to something in the blood you were given. Every precaution is taken to prevent this. The decision to have a blood transfusion has been considered carefully by your caregiver before blood is given. Blood is not given unless the benefits outweigh the risks. AFTER THE TRANSFUSION  Right after receiving a blood transfusion, you will usually feel much better and more energetic. This is especially true if your red blood cells have gotten low (anemic). The transfusion raises the level of the red blood cells which carry oxygen, and this usually causes an energy increase.  The nurse administering the transfusion will monitor you carefully for complications. HOME CARE INSTRUCTIONS  No special instructions are needed after a transfusion. You may find your energy is better. Speak with your caregiver about any limitations on activity for underlying diseases you may have. SEEK MEDICAL CARE IF:   Your condition is not  improving after your transfusion.  You develop redness or irritation at the intravenous (IV) site. SEEK IMMEDIATE MEDICAL CARE IF:  Any of the following symptoms occur over the next  12 hours:  Shaking chills.  You have a temperature by mouth above 102 F (38.9 C), not controlled by medicine.  Chest, back, or muscle pain.  People around you feel you are not acting correctly or are confused.  Shortness of breath or difficulty breathing.  Dizziness and fainting.  You get a rash or develop hives.  You have a decrease in urine output.  Your urine turns a dark color or changes to pink, red, or brown. Any of the following symptoms occur over the next 10 days:  You have a temperature by mouth above 102 F (38.9 C), not controlled by medicine.  Shortness of breath.  Weakness after normal activity.  The white part of the eye turns yellow (jaundice).  You have a decrease in the amount of urine or are urinating less often.  Your urine turns a dark color or changes to pink, red, or brown. Document Released: 03/17/2000 Document Revised: 06/12/2011 Document Reviewed: 11/04/2007 [facility] Patient Information 2014 [place].   Incentive Spirometer  An incentive spirometer is a tool that can help keep your lungs clear and active. This tool measures how well you are filling your lungs with each breath. Taking long deep breaths may help reverse or decrease the chance of developing breathing (pulmonary) problems (especially infection) following:  A long period of time when you are unable to move or be active. BEFORE THE PROCEDURE   If the spirometer includes an indicator to show your best effort, your nurse or respiratory therapist will set it to a desired goal.  If possible, sit up straight or lean slightly forward. Try not to slouch.  Hold the incentive spirometer in an upright position. INSTRUCTIONS FOR USE  1. Sit on the edge of your bed if possible, or sit up as far as you  can in bed or on a chair. 2. Hold the incentive spirometer in an upright position. 3. Breathe out normally. 4. Place the mouthpiece in your mouth and seal your lips tightly around it. 5. Breathe in slowly and as deeply as possible, raising the piston or the ball toward the top of the column. 6. Hold your breath for 3-5 seconds or for as long as possible. Allow the piston or ball to fall to the bottom of the column. 7. Remove the mouthpiece from your mouth and breathe out normally. 8. Rest for a few seconds and repeat Steps 1 through 7 at least 10 times every 1-2 hours when you are awake. Take your time and take a few normal breaths between deep breaths. 9. The spirometer may include an indicator to show your best effort. Use the indicator as a goal to work toward during each repetition. 10. After each set of 10 deep breaths, practice coughing to be sure your lungs are clear. If you have an incision (the cut made at the time of surgery), support your incision when coughing by placing a pillow or rolled up towels firmly against it. Once you are able to get out of bed, walk around indoors and cough well. You may stop using the incentive spirometer when instructed by your caregiver.  RISKS AND COMPLICATIONS  Take your time so you do not get dizzy or light-headed.  If you are in pain, you may need to take or ask for pain medication before doing incentive spirometry. It is harder to take a deep breath if you are having pain. AFTER USE  Rest and breathe slowly and easily.  It can be helpful to keep  track of a log of your progress. Your caregiver can provide you with a simple table to help with this. If you are using the spirometer at home, follow these instructions: [place] IF:   You are having difficultly using the spirometer.  You have trouble using the spirometer as often as instructed.  Your pain medication is not giving enough relief while using the spirometer.  You develop  fever of 100.5 F (38.1 C) or higher. SEEK IMMEDIATE MEDICAL CARE IF:   You cough up bloody sputum that had not been present before.  You develop fever of 102 F (38.9 C) or greater.  You develop worsening pain at or near the incision site. MAKE SURE YOU:   Understand these instructions.  Will watch your condition.  Will get help right away if you are not doing well or get worse. Document Released: 07/31/2006 Document Revised: 06/12/2011 Document Reviewed: 10/01/2006 [facility] Patient Information 2014 [place], [place].

## 2013-07-07 ENCOUNTER — Encounter (HOSPITAL_COMMUNITY): Payer: Self-pay

## 2013-07-07 ENCOUNTER — Encounter (HOSPITAL_COMMUNITY)
Admission: RE | Admit: 2013-07-07 | Discharge: 2013-07-07 | Disposition: A | Discharge status: Home / Self Care | Payer: 59 | Source: Ambulatory Visit | Attending: Orthopedic Surgery | Admitting: Orthopedic Surgery

## 2013-07-07 ENCOUNTER — Encounter (HOSPITAL_COMMUNITY): Payer: Self-pay | Admitting: Pharmacy Technician

## 2013-07-07 ENCOUNTER — Encounter (INDEPENDENT_AMBULATORY_CARE_PROVIDER_SITE_OTHER): Payer: Self-pay

## 2013-07-07 DIAGNOSIS — Z01812 Encounter for preprocedural laboratory examination: Secondary | ICD-10-CM | POA: Insufficient documentation

## 2013-07-07 HISTORY — DX: Chronic sinusitis, unspecified: J32.9

## 2013-07-07 HISTORY — DX: Anemia, unspecified: D64.9

## 2013-07-07 HISTORY — DX: Post-traumatic stress disorder, unspecified: F43.10

## 2013-07-07 HISTORY — DX: Unspecified osteoarthritis, unspecified site: M19.90

## 2013-07-07 HISTORY — DX: Chronic kidney disease, unspecified: N18.9

## 2013-07-07 LAB — BASIC METABOLIC PANEL
BUN: 16 mg/dL (ref 6–23)
CALCIUM: 9.7 mg/dL (ref 8.4–10.5)
CO2: 29 mEq/L (ref 19–32)
Chloride: 102 mEq/L (ref 96–112)
Creatinine, Ser: 0.79 mg/dL (ref 0.50–1.10)
Glucose, Bld: 84 mg/dL (ref 70–99)
POTASSIUM: 5 meq/L (ref 3.7–5.3)
SODIUM: 142 meq/L (ref 137–147)

## 2013-07-07 LAB — APTT: APTT: 29 s (ref 24–37)

## 2013-07-07 LAB — CBC
HCT: 41.5 % (ref 36.0–46.0)
HEMOGLOBIN: 13.6 g/dL (ref 12.0–15.0)
MCH: 28.4 pg (ref 26.0–34.0)
MCHC: 32.8 g/dL (ref 30.0–36.0)
MCV: 86.6 fL (ref 78.0–100.0)
PLATELETS: 292 10*3/uL (ref 150–400)
RBC: 4.79 MIL/uL (ref 3.87–5.11)
RDW: 14.6 % (ref 11.5–15.5)
WBC: 7.5 10*3/uL (ref 4.0–10.5)

## 2013-07-07 LAB — URINALYSIS, ROUTINE W REFLEX MICROSCOPIC
BILIRUBIN URINE: NEGATIVE
Glucose, UA: NEGATIVE mg/dL
HGB URINE DIPSTICK: NEGATIVE
KETONES UR: NEGATIVE mg/dL
Leukocytes, UA: NEGATIVE
NITRITE: NEGATIVE
PH: 6.5 (ref 5.0–8.0)
Protein, ur: NEGATIVE mg/dL
Specific Gravity, Urine: 1.025 (ref 1.005–1.030)
Urobilinogen, UA: 0.2 mg/dL (ref 0.0–1.0)

## 2013-07-07 LAB — SURGICAL PCR SCREEN
MRSA, PCR: NEGATIVE
STAPHYLOCOCCUS AUREUS: NEGATIVE

## 2013-07-07 LAB — ABO/RH: ABO/RH(D): B POS

## 2013-07-07 LAB — PROTIME-INR
INR: 0.85 (ref 0.00–1.49)
PROTHROMBIN TIME: 11.5 s — AB (ref 11.6–15.2)

## 2013-07-07 NOTE — Progress Notes (Signed)
EKG 06/25/13 on chart  Clearance- Dr Truman Hayward and 06/25/13 office visit note on chart - Dr Truman Hayward on chart

## 2013-07-13 NOTE — H&P (Signed)
TOTAL HIP ADMISSION H&P  Patient is admitted for right total hip arthroplasty, anterior approach.  Subjective:  Chief Complaint: Right hip OA / pain  HPI: Janet Potts, 53 y.o. female, has a history of pain and functional disability in the right hip(s) due to arthritis and patient has failed non-surgical conservative treatments for greater than 12 weeks to include NSAID's and/or analgesics, corticosteriod injections, use of assistive devices and activity modification.  Onset of symptoms was gradual starting  years ago with gradually worsening course since that time.The patient noted no past surgery on the right hip(s).  Patient currently rates pain in the right hip at 10 out of 10 with activity. Patient has night pain, worsening of pain with activity and weight bearing, trendelenberg gait, pain that interfers with activities of daily living and pain with passive range of motion. Patient has evidence of periarticular osteophytes and joint space narrowing by imaging studies. This condition presents safety issues increasing the risk of falls.  There is no current active infection.  Risks, benefits and expectations were discussed with the patient.  Risks including but not limited to the risk of anesthesia, blood clots, nerve damage, blood vessel damage, failure of the prosthesis, infection and up to and including death.  Patient understand the risks, benefits and expectations and wishes to proceed with surgery.   D/C Plans:   Home with HHPT  Post-op Meds:     No Rx given   Tranexamic Acid:   To be given  Decadron:    To be given  FYI:    Xarelto - post-op  (no ASA do to gastric surgery)  Norco post-op   No Celebrex  Patient Active Problem List   Diagnosis Date Noted  . Herniation of cervical intervertebral disc 05/31/2012   Past Medical History  Diagnosis Date  . Complication of anesthesia   . PONV (postoperative nausea and vomiting)     occ  . 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 Gastric bypass status for obesity 06  . Anxiety     anxiety  . Pneumonia     20's  . UTI (urinary tract infection)     ? irritated  . GERD (gastroesophageal reflux disease)     hx before gastric bypass  . Fibromyalgia     ?  Marland Kitchen Sinusitis   . PTSD (post-traumatic stress disorder)     due to numerous surgeries   . Chronic kidney disease     cyst on [place]   . Arthritis   . Cancer     hx of precancerous cells being removed   . Anemia     hx of at age 20     Past Surgical History  Procedure Laterality Date  . Gastic  06    bypass  . Abdominal hysterectomy  98,08    partial, oophorectomy bil  . Tonsillectomy  74  . Ankle arthroscopy Right 99    cartilage  . Sinus exploration  00  . Cholecystectomy  07  . Appendectomy  07  . Gastricsurgery  10    repair intestinal rupture  . Cervical disc arthroplasty N/A 05/31/2012    Procedure: CERVICAL ANTERIOR DISC ARTHROPLASTY;  Surgeon: Charlie Pitter, MD;  Location: [facility];  Service: Neurosurgery;  Laterality: N/A;  C-Arm  . Ankle surgery      left ankle with titanium plate   . Gastric surgeyr  for intestinal blockage due to pouch rupturing     No prescriptions prior to admission   Allergies  Allergen Reactions  . Other     Silk tape  . Silvadene [Silver Sulfadiazine] Swelling    hiaves and rash   . Sulfa Antibiotics Swelling  . Adhesive [Tape] Hives, Swelling and Rash  . Avelox [Moxifloxacin] Rash    History  Substance Use Topics  . Smoking status: Never Smoker   . Smokeless tobacco: Never Used  . Alcohol Use: 3.4 oz/week    4 Glasses of wine, 2 Drinks containing 0.5 oz of alcohol per week    No family history on file.   Review of Systems  Constitutional: Negative.   HENT: Negative.   Eyes: Negative.   Respiratory: Negative.   Cardiovascular: Negative.   Gastrointestinal: Positive for heartburn.  Genitourinary: Negative.    Musculoskeletal: Positive for back pain, joint pain and myalgias.  Skin: Negative.   Neurological: Negative.   Endo/Heme/Allergies: Negative.   Psychiatric/Behavioral: Positive for depression. The patient is nervous/anxious.     Objective:  Physical Exam  Constitutional: She is oriented to person, place, and time. She appears well-developed and well-nourished.  HENT:  Head: Normocephalic and atraumatic.  Mouth/Throat: Oropharynx is clear and moist.  Eyes: Pupils are equal, round, and reactive to light.  Neck: Neck supple. No JVD present. No tracheal deviation present. No thyromegaly present.  Cardiovascular: Normal rate, regular rhythm, normal heart sounds and intact distal pulses.   Respiratory: Effort normal and breath sounds normal. No stridor. No respiratory distress. She has no wheezes.  GI: Soft. There is no tenderness. There is no guarding.  Musculoskeletal:       Right hip: She exhibits decreased range of motion, decreased strength, tenderness and bony tenderness. She exhibits no swelling, no deformity and no laceration.  Lymphadenopathy:    She has no cervical adenopathy.  Neurological: She is alert and oriented to person, place, and time.  Skin: Skin is warm and dry.  Psychiatric: She has a normal mood and affect.    Labs:  Estimated body mass index is 28.05 kg/(m^2) as calculated from the following:   Height as of 05/24/12: 5\' 3"  (1.6 m).   Weight as of 05/24/12: 71.799 kg (158 lb 4.6 oz).   Imaging Review Plain radiographs demonstrate severe degenerative joint disease of the right hip(s). The bone quality appears to be good for age and reported activity level.  Assessment/Plan:  End stage arthritis, right hip(s)  The patient history, physical examination, clinical judgement of the provider and imaging studies are consistent with end stage degenerative joint disease of the right hip(s) and total hip arthroplasty is deemed medically necessary. The treatment options  including medical management, injection therapy, arthroscopy and arthroplasty were discussed at length. The risks and benefits of total hip arthroplasty were presented and reviewed. The risks due to aseptic loosening, infection, stiffness, dislocation/subluxation,  thromboembolic complications and other imponderables were discussed.  The patient acknowledged the explanation, agreed to proceed with the plan and consent was signed. Patient is being admitted for inpatient treatment for surgery, pain control, PT, OT, prophylactic antibiotics, VTE prophylaxis, progressive ambulation and ADL's and discharge planning.The patient is planning to be discharged home with home health services .      West Pugh Elfie Costanza   PAC  07/13/2013, 11:06 AM

## 2013-07-15 ENCOUNTER — Inpatient Hospital Stay (HOSPITAL_COMMUNITY): Payer: 59

## 2013-07-15 ENCOUNTER — Inpatient Hospital Stay (HOSPITAL_COMMUNITY): Payer: 59 | Admitting: Anesthesiology

## 2013-07-15 ENCOUNTER — Inpatient Hospital Stay (HOSPITAL_COMMUNITY)
Admission: RE | Admit: 2013-07-15 | Discharge: 2013-07-17 | DRG: 470 | Disposition: A | Discharge status: Home Health | Payer: 59 | Source: Ambulatory Visit | Attending: Orthopedic Surgery | Admitting: Orthopedic Surgery

## 2013-07-15 ENCOUNTER — Encounter (HOSPITAL_COMMUNITY): Payer: 59 | Admitting: Anesthesiology

## 2013-07-15 ENCOUNTER — Encounter (HOSPITAL_COMMUNITY)
Admission: RE | Disposition: A | Discharge status: Home Health | Payer: Self-pay | Source: Ambulatory Visit | Attending: Orthopedic Surgery

## 2013-07-15 ENCOUNTER — Encounter (HOSPITAL_COMMUNITY): Payer: Self-pay | Admitting: *Deleted

## 2013-07-15 DIAGNOSIS — Z6830 Body mass index (BMI) 30.0-30.9, adult: Secondary | ICD-10-CM

## 2013-07-15 DIAGNOSIS — E669 Obesity, unspecified: Secondary | ICD-10-CM | POA: Diagnosis present

## 2013-07-15 DIAGNOSIS — K219 Gastro-esophageal reflux disease without esophagitis: Secondary | ICD-10-CM | POA: Diagnosis present

## 2013-07-15 DIAGNOSIS — M1610 Unilateral primary osteoarthritis, unspecified hip: Principal | ICD-10-CM | POA: Diagnosis present

## 2013-07-15 DIAGNOSIS — Z96649 Presence of unspecified artificial hip joint: Secondary | ICD-10-CM

## 2013-07-15 DIAGNOSIS — D500 Iron deficiency anemia secondary to blood loss (chronic): Secondary | ICD-10-CM | POA: Diagnosis not present

## 2013-07-15 DIAGNOSIS — M169 Osteoarthritis of hip, unspecified: Principal | ICD-10-CM | POA: Diagnosis present

## 2013-07-15 DIAGNOSIS — Z9884 Bariatric surgery status: Secondary | ICD-10-CM

## 2013-07-15 DIAGNOSIS — D62 Acute posthemorrhagic anemia: Secondary | ICD-10-CM | POA: Diagnosis not present

## 2013-07-15 DIAGNOSIS — Z01812 Encounter for preprocedural laboratory examination: Secondary | ICD-10-CM

## 2013-07-15 DIAGNOSIS — Q6589 Other specified congenital deformities of hip: Secondary | ICD-10-CM

## 2013-07-15 DIAGNOSIS — F411 Generalized anxiety disorder: Secondary | ICD-10-CM | POA: Diagnosis present

## 2013-07-15 DIAGNOSIS — M62838 Other muscle spasm: Secondary | ICD-10-CM | POA: Diagnosis not present

## 2013-07-15 HISTORY — PX: TOTAL HIP ARTHROPLASTY: SHX124

## 2013-07-15 LAB — TYPE AND SCREEN
ABO/RH(D): B POS
ANTIBODY SCREEN: NEGATIVE

## 2013-07-15 SURGERY — ARTHROPLASTY, HIP, TOTAL, ANTERIOR APPROACH
Anesthesia: Spinal | Site: Hip | Laterality: Right

## 2013-07-15 MED ORDER — ONDANSETRON HCL 4 MG/2ML IJ SOLN
INTRAMUSCULAR | Status: AC
  Filled 2013-07-15: qty 2

## 2013-07-15 MED ORDER — PROPOFOL 10 MG/ML IV BOLUS
INTRAVENOUS | Status: AC
  Filled 2013-07-15: qty 20

## 2013-07-15 MED ORDER — MIDAZOLAM HCL 5 MG/5ML IJ SOLN
INTRAMUSCULAR | Status: DC | PRN
  Administered 2013-07-15: 2 mg via INTRAVENOUS

## 2013-07-15 MED ORDER — FENTANYL CITRATE 0.05 MG/ML IJ SOLN
INTRAMUSCULAR | Status: DC | PRN
  Administered 2013-07-15: 100 ug via INTRAVENOUS

## 2013-07-15 MED ORDER — EST ESTROGENS-METHYLTEST 1.25-2.5 MG PO TABS: 1.0000 | ORAL_TABLET | Freq: Every evening | ORAL | Status: DC

## 2013-07-15 MED ORDER — MIDAZOLAM HCL 2 MG/2ML IJ SOLN
INTRAMUSCULAR | Status: AC
  Filled 2013-07-15: qty 2

## 2013-07-15 MED ORDER — ONDANSETRON HCL 4 MG/2ML IJ SOLN
INTRAMUSCULAR | Status: DC | PRN
  Administered 2013-07-15: 4 mg via INTRAVENOUS

## 2013-07-15 MED ORDER — METHOCARBAMOL 500 MG PO TABS
500.0000 mg | ORAL_TABLET | Freq: Four times a day (QID) | ORAL | Status: DC | PRN
  Administered 2013-07-16 – 2013-07-17 (×4): 500 mg via ORAL
  Filled 2013-07-15 (×4): qty 1

## 2013-07-15 MED ORDER — ALPRAZOLAM 0.5 MG PO TABS: 0.5000 mg | ORAL_TABLET | Freq: Every day | ORAL | Status: DC | PRN

## 2013-07-15 MED ORDER — DEXAMETHASONE SODIUM PHOSPHATE 10 MG/ML IJ SOLN
10.0000 mg | Freq: Once | INTRAMUSCULAR | Status: AC
  Administered 2013-07-15: 10 mg via INTRAVENOUS

## 2013-07-15 MED ORDER — METOCLOPRAMIDE HCL 5 MG/ML IJ SOLN: 5.0000 mg | Freq: Three times a day (TID) | INTRAMUSCULAR | Status: DC | PRN

## 2013-07-15 MED ORDER — DEXTROSE 5 % IV SOLN
20.0000 mg | INTRAVENOUS | Status: DC | PRN
  Administered 2013-07-15: 15 ug/min via INTRAVENOUS

## 2013-07-15 MED ORDER — ONDANSETRON HCL 4 MG/2ML IJ SOLN
4.0000 mg | Freq: Four times a day (QID) | INTRAMUSCULAR | Status: DC | PRN
  Administered 2013-07-16 (×2): 4 mg via INTRAVENOUS
  Filled 2013-07-15 (×2): qty 2

## 2013-07-15 MED ORDER — HYDROMORPHONE HCL PF 1 MG/ML IJ SOLN
INTRAMUSCULAR | Status: AC
  Administered 2013-07-15: 1 mg
  Filled 2013-07-15: qty 1

## 2013-07-15 MED ORDER — PROMETHAZINE HCL 25 MG/ML IJ SOLN: 6.2500 mg | INTRAMUSCULAR | Status: DC | PRN

## 2013-07-15 MED ORDER — 0.9 % SODIUM CHLORIDE (POUR BTL) OPTIME
TOPICAL | Status: DC | PRN
  Administered 2013-07-15: 1000 mL

## 2013-07-15 MED ORDER — CEFAZOLIN SODIUM-DEXTROSE 2-3 GM-% IV SOLR
INTRAVENOUS | Status: AC
  Filled 2013-07-15: qty 50

## 2013-07-15 MED ORDER — POLYETHYLENE GLYCOL 3350 17 G PO PACK
17.0000 g | PACK | Freq: Two times a day (BID) | ORAL | Status: DC
  Administered 2013-07-15 – 2013-07-17 (×4): 17 g via ORAL

## 2013-07-15 MED ORDER — CHLORHEXIDINE GLUCONATE 4 % EX LIQD: 60.0000 mL | Freq: Once | CUTANEOUS | Status: DC

## 2013-07-15 MED ORDER — POTASSIUM CHLORIDE 2 MEQ/ML IV SOLN
100.0000 mL/h | INTRAVENOUS | Status: DC
  Administered 2013-07-15 – 2013-07-16 (×4): 100 mL/h via INTRAVENOUS
  Filled 2013-07-15 (×8): qty 1000

## 2013-07-15 MED ORDER — METOCLOPRAMIDE HCL 5 MG PO TABS
5.0000 mg | ORAL_TABLET | Freq: Three times a day (TID) | ORAL | Status: DC | PRN
  Filled 2013-07-15: qty 2

## 2013-07-15 MED ORDER — BUPIVACAINE HCL (PF) 0.5 % IJ SOLN
INTRAMUSCULAR | Status: DC | PRN
  Administered 2013-07-15: 3 mL

## 2013-07-15 MED ORDER — LACTATED RINGERS IV SOLN
INTRAVENOUS | Status: DC | PRN
  Administered 2013-07-15 (×2): via INTRAVENOUS

## 2013-07-15 MED ORDER — MENTHOL 3 MG MT LOZG
1.0000 | LOZENGE | OROMUCOSAL | Status: DC | PRN
  Filled 2013-07-15: qty 9

## 2013-07-15 MED ORDER — BISACODYL 10 MG RE SUPP: 10.0000 mg | Freq: Every day | RECTAL | Status: DC | PRN

## 2013-07-15 MED ORDER — OXYCODONE HCL 5 MG/5ML PO SOLN
5.0000 mg | Freq: Once | ORAL | Status: DC | PRN
  Filled 2013-07-15: qty 5

## 2013-07-15 MED ORDER — METHOCARBAMOL 100 MG/ML IJ SOLN
500.0000 mg | Freq: Four times a day (QID) | INTRAMUSCULAR | Status: DC | PRN
  Administered 2013-07-15 – 2013-07-16 (×3): 500 mg via INTRAVENOUS
  Filled 2013-07-15 (×2): qty 5

## 2013-07-15 MED ORDER — LACTATED RINGERS IV SOLN: INTRAVENOUS | Status: DC

## 2013-07-15 MED ORDER — LIDOCAINE HCL (CARDIAC) 20 MG/ML IV SOLN
INTRAVENOUS | Status: DC | PRN
  Administered 2013-07-15: 50 mg via INTRAVENOUS

## 2013-07-15 MED ORDER — ALUM & MAG HYDROXIDE-SIMETH 200-200-20 MG/5ML PO SUSP: 30.0000 mL | ORAL | Status: DC | PRN

## 2013-07-15 MED ORDER — DIPHENHYDRAMINE HCL 25 MG PO CAPS
25.0000 mg | ORAL_CAPSULE | Freq: Four times a day (QID) | ORAL | Status: DC | PRN
  Administered 2013-07-16 – 2013-07-17 (×2): 25 mg via ORAL
  Filled 2013-07-15 (×3): qty 1

## 2013-07-15 MED ORDER — HYDROMORPHONE HCL PF 1 MG/ML IJ SOLN
0.2500 mg | INTRAMUSCULAR | Status: DC | PRN
  Administered 2013-07-15 (×2): 0.5 mg via INTRAVENOUS

## 2013-07-15 MED ORDER — OXYCODONE HCL 5 MG PO TABS: 5.0000 mg | ORAL_TABLET | Freq: Once | ORAL | Status: DC | PRN

## 2013-07-15 MED ORDER — BUPIVACAINE HCL (PF) 0.5 % IJ SOLN
INTRAMUSCULAR | Status: AC
  Filled 2013-07-15: qty 30

## 2013-07-15 MED ORDER — TRANEXAMIC ACID 100 MG/ML IV SOLN
1000.0000 mg | Freq: Once | INTRAVENOUS | Status: AC
  Administered 2013-07-15: 1000 mg via INTRAVENOUS
  Filled 2013-07-15: qty 10

## 2013-07-15 MED ORDER — HYDROMORPHONE HCL PF 1 MG/ML IJ SOLN
0.5000 mg | INTRAMUSCULAR | Status: DC | PRN
  Administered 2013-07-15: 2 mg via INTRAVENOUS
  Administered 2013-07-15: 1 mg via INTRAVENOUS
  Administered 2013-07-15 – 2013-07-17 (×6): 2 mg via INTRAVENOUS
  Filled 2013-07-15: qty 2
  Filled 2013-07-15: qty 1
  Filled 2013-07-15 (×5): qty 2
  Filled 2013-07-15 (×2): qty 1
  Filled 2013-07-15: qty 2

## 2013-07-15 MED ORDER — MAGNESIUM CITRATE PO SOLN: 1.0000 | Freq: Once | ORAL | Status: AC | PRN

## 2013-07-15 MED ORDER — PHENYLEPHRINE HCL 10 MG/ML IJ SOLN
INTRAMUSCULAR | Status: AC
  Filled 2013-07-15: qty 2

## 2013-07-15 MED ORDER — CEFAZOLIN SODIUM-DEXTROSE 2-3 GM-% IV SOLR
2.0000 g | INTRAVENOUS | Status: AC
  Administered 2013-07-15: 2 g via INTRAVENOUS

## 2013-07-15 MED ORDER — FERROUS SULFATE 325 (65 FE) MG PO TABS
325.0000 mg | ORAL_TABLET | Freq: Three times a day (TID) | ORAL | Status: DC
  Administered 2013-07-16 – 2013-07-17 (×2): 325 mg via ORAL
  Filled 2013-07-15 (×7): qty 1

## 2013-07-15 MED ORDER — MEPERIDINE HCL 50 MG/ML IJ SOLN: 6.2500 mg | INTRAMUSCULAR | Status: DC | PRN

## 2013-07-15 MED ORDER — ONDANSETRON HCL 4 MG PO TABS: 4.0000 mg | ORAL_TABLET | Freq: Four times a day (QID) | ORAL | Status: DC | PRN

## 2013-07-15 MED ORDER — HYDROCODONE-ACETAMINOPHEN 7.5-325 MG PO TABS
1.0000 | ORAL_TABLET | ORAL | Status: DC
  Administered 2013-07-15 – 2013-07-17 (×10): 2 via ORAL
  Filled 2013-07-15: qty 2
  Filled 2013-07-15: qty 1
  Filled 2013-07-15 (×4): qty 2
  Filled 2013-07-15: qty 1
  Filled 2013-07-15 (×5): qty 2

## 2013-07-15 MED ORDER — PHENOL 1.4 % MT LIQD: 1.0000 | OROMUCOSAL | Status: DC | PRN

## 2013-07-15 MED ORDER — DEXAMETHASONE SODIUM PHOSPHATE 10 MG/ML IJ SOLN
INTRAMUSCULAR | Status: AC
  Filled 2013-07-15: qty 1

## 2013-07-15 MED ORDER — RIVAROXABAN 10 MG PO TABS
10.0000 mg | ORAL_TABLET | ORAL | Status: DC
  Administered 2013-07-16 – 2013-07-17 (×2): 10 mg via ORAL
  Filled 2013-07-15 (×3): qty 1

## 2013-07-15 MED ORDER — FENTANYL CITRATE 0.05 MG/ML IJ SOLN
INTRAMUSCULAR | Status: AC
  Filled 2013-07-15: qty 2

## 2013-07-15 MED ORDER — CEFAZOLIN SODIUM-DEXTROSE 2-3 GM-% IV SOLR
2.0000 g | Freq: Four times a day (QID) | INTRAVENOUS | Status: AC
  Administered 2013-07-15 (×2): 2 g via INTRAVENOUS
  Filled 2013-07-15 (×2): qty 50

## 2013-07-15 MED ORDER — DOCUSATE SODIUM 100 MG PO CAPS
100.0000 mg | ORAL_CAPSULE | Freq: Two times a day (BID) | ORAL | Status: DC
  Administered 2013-07-15 – 2013-07-17 (×3): 100 mg via ORAL

## 2013-07-15 MED ORDER — DEXAMETHASONE SODIUM PHOSPHATE 10 MG/ML IJ SOLN
10.0000 mg | Freq: Once | INTRAMUSCULAR | Status: AC
  Administered 2013-07-16: 10 mg via INTRAVENOUS
  Filled 2013-07-15: qty 1

## 2013-07-15 MED ORDER — LIDOCAINE HCL (CARDIAC) 20 MG/ML IV SOLN
INTRAVENOUS | Status: AC
  Filled 2013-07-15: qty 5

## 2013-07-15 MED ORDER — PROPOFOL INFUSION 10 MG/ML OPTIME
INTRAVENOUS | Status: DC | PRN
  Administered 2013-07-15: 75 ug/kg/min via INTRAVENOUS

## 2013-07-15 SURGICAL SUPPLY — 37 items
ADH SKN CLS APL DERMABOND .7 (GAUZE/BANDAGES/DRESSINGS) ×1
BAG SPEC THK2 15X12 ZIP CLS (MISCELLANEOUS)
BAG ZIPLOCK 12X15 (MISCELLANEOUS) IMPLANT
CAPT HIP PF COP ×1 IMPLANT
DERMABOND ADVANCED (GAUZE/BANDAGES/DRESSINGS) ×1
DERMABOND ADVANCED .7 DNX12 (GAUZE/BANDAGES/DRESSINGS) ×1 IMPLANT
DRAPE C-ARM 42X120 X-RAY (DRAPES) ×2 IMPLANT
DRAPE STERI IOBAN 125X83 (DRAPES) ×2 IMPLANT
DRAPE U-SHAPE 47X51 STRL (DRAPES) ×6 IMPLANT
DRSG AQUACEL AG ADV 3.5X10 (GAUZE/BANDAGES/DRESSINGS) ×2 IMPLANT
DRSG TEGADERM 4X4.75 (GAUZE/BANDAGES/DRESSINGS) IMPLANT
DURAPREP 26ML APPLICATOR (WOUND CARE) ×2 IMPLANT
ELECT BLADE TIP CTD 4 INCH (ELECTRODE) ×2 IMPLANT
ELECT PENCIL ROCKER SW 15FT (MISCELLANEOUS) ×2 IMPLANT
ELECT REM PT RETURN 15FT ADLT (MISCELLANEOUS) ×2 IMPLANT
FACESHIELD WRAPAROUND (MASK) IMPLANT
FACESHIELD WRAPAROUND OR TEAM (MASK) ×4 IMPLANT
GLOVE BIOGEL PI IND STRL 7.5 (GLOVE) ×1 IMPLANT
GLOVE BIOGEL PI IND STRL 8 (GLOVE) ×1 IMPLANT
GLOVE BIOGEL PI INDICATOR 7.5 (GLOVE) ×1
GLOVE BIOGEL PI INDICATOR 8 (GLOVE) ×1
GLOVE ECLIPSE 8.0 STRL XLNG CF (GLOVE) ×2 IMPLANT
GLOVE ORTHO TXT STRL SZ7.5 (GLOVE) ×4 IMPLANT
GOWN SPEC L3 XXLG W/TWL (GOWN DISPOSABLE) ×2 IMPLANT
GOWN STRL REUS W/TWL LRG LVL3 (GOWN DISPOSABLE) ×2 IMPLANT
KIT BASIN OR (CUSTOM PROCEDURE TRAY) ×2 IMPLANT
PACK TOTAL JOINT (CUSTOM PROCEDURE TRAY) ×2 IMPLANT
PADDING CAST COTTON 6X4 STRL (CAST SUPPLIES) ×2 IMPLANT
SAW OSC TIP CART 19.5X105X1.3 (SAW) ×2 IMPLANT
SUT MNCRL AB 4-0 PS2 18 (SUTURE) ×2 IMPLANT
SUT VIC AB 1 CT1 36 (SUTURE) ×6 IMPLANT
SUT VIC AB 2-0 CT1 27 (SUTURE) ×4
SUT VIC AB 2-0 CT1 TAPERPNT 27 (SUTURE) ×2 IMPLANT
SUT VLOC 180 0 24IN GS25 (SUTURE) ×2 IMPLANT
TOWEL OR 17X26 10 PK STRL BLUE (TOWEL DISPOSABLE) ×2 IMPLANT
TOWEL OR NON WOVEN STRL DISP B (DISPOSABLE) ×1 IMPLANT
TRAY FOLEY CATH 14FRSI W/METER (CATHETERS) ×2 IMPLANT

## 2013-07-15 NOTE — Plan of Care (Signed)
Problem: Consults Goal: Diagnosis- Total Joint Replacement Right anterior hip     

## 2013-07-15 NOTE — Anesthesia Preprocedure Evaluation (Signed)
Anesthesia Evaluation  Patient identified by MRN, date of birth, ID band Patient awake    Reviewed: Allergy & Precautions, H&P , NPO status   History of Anesthesia Complications (+) PONV and history of anesthetic complications  Airway Mallampati: II TM Distance: >3 FB Neck ROM: Full    Dental  (+) Dental Advisory Given   Pulmonary pneumonia -, resolved,  breath sounds clear to auscultation        Cardiovascular Exercise Tolerance: Good negative cardio ROS  Rhythm:Regular Rate:Normal     Neuro/Psych PSYCHIATRIC DISORDERS Anxiety  Neuromuscular disease    GI/Hepatic GERD-  Medicated,  Endo/Other    Renal/GU Renal disease     Musculoskeletal  (+) Fibromyalgia -  Abdominal   Peds  Hematology  (+) anemia ,   Anesthesia Other Findings   Reproductive/Obstetrics                           Anesthesia Physical  Anesthesia Plan  ASA: II  Anesthesia Plan:    Post-op Pain Management:    Induction:   Airway Management Planned:   Additional Equipment:   Intra-op Plan:   Post-operative Plan:   Informed Consent: I have reviewed the patients History and Physical, chart, labs and discussed the procedure including the risks, benefits and alternatives for the proposed anesthesia with the patient or authorized representative who has indicated his/her understanding and acceptance.   Dental advisory given  Plan Discussed with: CRNA, Anesthesiologist and Surgeon  Anesthesia Plan Comments:         Anesthesia Quick Evaluation

## 2013-07-15 NOTE — Transfer of Care (Signed)
Immediate Anesthesia Transfer of Care Note  Patient: Janet Potts  Procedure(s) Performed: Procedure(s): RIGHT TOTAL HIP ARTHROPLASTY ANTERIOR APPROACH (Right)  Patient Location: PACU  Anesthesia Type:Regional  Level of Consciousness: awake, alert  and oriented  Airway & Oxygen Therapy: Patient Spontanous Breathing and Patient connected to face mask oxygen  Post-op Assessment: Report given to PACU RN and Post -op Vital signs reviewed and stable  Post vital signs: Reviewed and stable  Complications: No apparent anesthesia complications

## 2013-07-15 NOTE — Progress Notes (Signed)
X-ray results noted 

## 2013-07-15 NOTE — Op Note (Signed)
NAME:  Janet Potts                ACCOUNT NO.: [id number]      MEDICAL RECORD NO.: [id number]      FACILITY:  [facility]      PHYSICIAN:  Mauri Pole  DATE OF BIRTH:  10/08/1960     DATE OF PROCEDURE:  07/15/2013                                 OPERATIVE REPORT         PREOPERATIVE DIAGNOSIS: Right  hip osteoarthritis.      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      SURGEON:  Pietro Cassis. Alvan Dame, M.D.      ASSISTANT:  Nehemiah Massed, PA-C, Danae Orleans, PA-C     ANESTHESIA:  Spinal.      SPECIMENS:  None.      COMPLICATIONS:  None.      BLOOD LOSS:  450 cc     DRAINS:  None      INDICATION OF THE PROCEDURE:  Janet Potts is a 53 y.o. female who had   presented to office for evaluation of right hip pain.  Radiographs revealed   progressive degenerative changes with bone-on-bone   articulation to the  hip joint.  The patient had painful limited range of   motion significantly affecting their overall quality of life.  The patient was failing to    respond to conservative measures, and at this point was ready   to proceed with more definitive measures.  The patient has noted progressive   degenerative changes in his hip, progressive problems and dysfunction   with regarding the hip prior to surgery.  Consent was obtained for   benefit of pain relief.  Specific risk of infection, DVT, component   failure, dislocation, need for revision surgery, as well discussion of   the anterior versus posterior approach were reviewed.  Consent was   obtained for benefit of anterior pain relief through an anterior   approach.      PROCEDURE IN DETAIL:  The patient was brought to operative theater.    Once adequate anesthesia, preoperative antibiotics, 2gm Ancef administered.   The patient was positioned supine on the OSI Hanna table.  Once adequate   padding of boney process was carried out, we had predraped out the hip, and  used fluoroscopy to confirm orientation of the pelvis and position.      The right hip was then prepped and draped from proximal iliac crest to   mid thigh with shower curtain technique.      Time-out was performed identifying the patient, planned procedure, and   extremity.     An incision was then made 2 cm distal and lateral to the   anterior superior iliac spine extending over the orientation of the   tensor fascia lata muscle and sharp dissection was carried down to the   fascia of the muscle and protractor placed in the soft tissues.      The fascia was then incised.  The muscle  belly was identified and swept   laterally and retractor placed along the superior neck.  Following   cauterization of the circumflex vessels and removing some pericapsular   fat, a second cobra retractor was placed on the inferior neck.  A third   retractor was placed on the anterior acetabulum after elevating the   anterior rectus.  A L-capsulotomy was along the line of the   superior neck to the trochanteric fossa, then extended proximally and   distally.  Tag sutures were placed and the retractors were then placed   intracapsular.  We then identified the trochanteric fossa and   orientation of my neck cut, confirmed this radiographically   and then made a neck osteotomy with the femur on traction.  The femoral   head was removed without difficulty or complication.  Traction was let   off and retractors were placed posterior and anterior around the   acetabulum.      The labrum and foveal tissue were debrided.  I began reaming with a 106mm   reamer and reamed up to 37mm reamer with good bony bed preparation and a 50   cup was chosen.  The final 73mm Pinnacle cup was then  impacted under fluoroscopy  to confirm the depth of penetration and orientation with respect to   abduction.  A screw was placed followed by the hole eliminator.  The final   32+4 neutral Altrex liner was impacted with good visualized rim fit.  The cup was positioned anatomically within the acetabular portion of the pelvis.      At this point, the femur was rolled at 80 degrees.  Further capsule was   released off the inferior aspect of the femoral neck.  I then   released the superior capsule proximally.  The hook was placed laterally   along the femur and elevated manually and held in position with the bed   hook.  The leg was then extended and adducted with the leg rolled to 100   degrees of external rotation.  Once the proximal femur was fully   exposed, I used a box osteotome to set orientation.  I then began   broaching with the starting chili pepper broach and passed this by hand and then broached up to 2.  With the 2 broach in place I chose a high offset neck and did a trial reduction.  The offset was appropriate, leg lengths   appeared to be equal, confirmed radiographically.   Given these findings, I went ahead and dislocated the hip, repositioned all   retractors and positioned the right hip in the extended and abducted position.  The final 2 Hi Tri Lock stem was   chosen and it was impacted down to the level of neck cut.  Based on this   and the trial reduction, a 32+1 delta ceramic ball was chosen and   impacted onto a clean and dry trunnion, and the hip was reduced.  The   hip had been irrigated throughout the case again at this point.  I did   reapproximate the superior capsular leaflet to the anterior leaflet   using #1 Vicryl.  The fascia of the   tensor fascia lata muscle was then reapproximated using #1 Vicryl and #0 V-lock sutures.  The   remaining wound was closed with 2-0 Vicryl and running 4-0 Monocryl.   The hip was cleaned, dried, and dressed sterilely using Dermabond  and   Aquacel dressing.  She was  then brought   to recovery room in stable condition tolerating the procedure well.    Nehemiah Massed and Danae Orleans, PA-Cs was present for the entirety of the case involved from   preoperative positioning, perioperative retractor management, general   facilitation of the case, as well as primary wound closure as assistant.            Pietro Cassis Alvan Dame, M.D.        07/15/2013 11:27 AM

## 2013-07-15 NOTE — Progress Notes (Signed)
Utilization review completed.  

## 2013-07-15 NOTE — Anesthesia Postprocedure Evaluation (Signed)
Anesthesia Post Note  Patient: Janet Potts  Procedure(s) Performed: Procedure(s) (LRB): RIGHT TOTAL HIP ARTHROPLASTY ANTERIOR APPROACH (Right)  Anesthesia type: Spinal  Patient location: PACU  Post pain: Pain level controlled  Post assessment: Post-op Vital signs reviewed  Last Vitals: BP 109/74  Pulse 56  Temp(Src) 36.4 C (Oral)  Resp 16  Ht 5\' 2"  (1.575 m)  Wt 169 lb (76.658 kg)  BMI 30.90 kg/m2  SpO2 99%  Post vital signs: Reviewed  Level of consciousness: sedated  Complications: No apparent anesthesia complications

## 2013-07-15 NOTE — Progress Notes (Signed)
Portable AP Pelvis and Lateral Right Hip X-rays done. 

## 2013-07-15 NOTE — Interval H&P Note (Signed)
History and Physical Interval Note:  07/15/2013 8:51 AM  Janet Potts  has presented today for surgery, with the diagnosis of RIGHT HIP OA  The various methods of treatment have been discussed with the patient and family. After consideration of risks, benefits and other options for treatment, the patient has consented to  Procedure(s): RIGHT TOTAL HIP ARTHROPLASTY ANTERIOR APPROACH (Right) as a surgical intervention .  The patient's history has been reviewed, patient examined, no change in status, stable for surgery.  I have reviewed the patient's chart and labs.  Questions were answered to the patient's satisfaction.     Mauri Pole

## 2013-07-15 NOTE — Progress Notes (Signed)
O.K. To go to floor with spinal level of L3- per Dr. Lissa Hoard

## 2013-07-15 NOTE — Anesthesia Procedure Notes (Signed)
Spinal  Patient location during procedure: OR End time: 07/15/2013 10:06 AM Staffing CRNA/Resident: Noralyn Pick Performed by: anesthesiologist and resident/CRNA  Preanesthetic Checklist Completed: patient identified, site marked, surgical consent, pre-op evaluation, timeout performed, IV checked, risks and benefits discussed and monitors and equipment checked Spinal Block Patient position: sitting Prep: Betadine Patient monitoring: heart rate, continuous pulse ox and blood pressure Approach: midline Location: L2-3 Injection technique: single-shot Needle Needle type: Sprotte and Pencil-Tip  Needle gauge: 24 G Needle length: 9 cm Assessment Sensory level: T6 Additional Notes Expiration date of kit checked and confirmed. Patient tolerated procedure well, without complications.

## 2013-07-16 DIAGNOSIS — E669 Obesity, unspecified: Secondary | ICD-10-CM | POA: Diagnosis present

## 2013-07-16 DIAGNOSIS — D500 Iron deficiency anemia secondary to blood loss (chronic): Secondary | ICD-10-CM | POA: Diagnosis not present

## 2013-07-16 LAB — CBC
HCT: 33.9 % — ABNORMAL LOW (ref 36.0–46.0)
HEMOGLOBIN: 11.7 g/dL — AB (ref 12.0–15.0)
MCH: 29.3 pg (ref 26.0–34.0)
MCHC: 34.5 g/dL (ref 30.0–36.0)
MCV: 85 fL (ref 78.0–100.0)
Platelets: 239 10*3/uL (ref 150–400)
RBC: 3.99 MIL/uL (ref 3.87–5.11)
RDW: 14.5 % (ref 11.5–15.5)
WBC: 16.6 10*3/uL — ABNORMAL HIGH (ref 4.0–10.5)

## 2013-07-16 LAB — BASIC METABOLIC PANEL
BUN: 8 mg/dL (ref 6–23)
CALCIUM: 8.7 mg/dL (ref 8.4–10.5)
CO2: 26 meq/L (ref 19–32)
Chloride: 104 mEq/L (ref 96–112)
Creatinine, Ser: 0.57 mg/dL (ref 0.50–1.10)
GFR calc Af Amer: >90 mL/min (ref >90)
Glucose, Bld: 147 mg/dL — ABNORMAL HIGH (ref 70–99)
Potassium: 5.3 mEq/L (ref 3.7–5.3)
SODIUM: 138 meq/L (ref 137–147)

## 2013-07-16 MED ORDER — HYDROCODONE-ACETAMINOPHEN 7.5-325 MG PO TABS: 1.0000 | ORAL_TABLET | ORAL | Status: DC | PRN

## 2013-07-16 MED ORDER — ALPRAZOLAM 0.5 MG PO TABS
0.5000 mg | ORAL_TABLET | Freq: Three times a day (TID) | ORAL | Status: DC | PRN
  Administered 2013-07-16: 0.5 mg via ORAL
  Filled 2013-07-16 (×3): qty 1

## 2013-07-16 MED ORDER — FLUCONAZOLE 150 MG PO TABS
150.0000 mg | ORAL_TABLET | Freq: Once | ORAL | Status: AC
  Administered 2013-07-16: 150 mg via ORAL
  Filled 2013-07-16: qty 1

## 2013-07-16 MED ORDER — METHOCARBAMOL 500 MG PO TABS: 500.0000 mg | ORAL_TABLET | Freq: Four times a day (QID) | ORAL | Status: DC | PRN

## 2013-07-16 MED ORDER — FERROUS SULFATE 325 (65 FE) MG PO TABS: 325.0000 mg | ORAL_TABLET | Freq: Three times a day (TID) | ORAL | Status: DC

## 2013-07-16 MED ORDER — POLYETHYLENE GLYCOL 3350 17 G PO PACK: 17.0000 g | PACK | Freq: Two times a day (BID) | ORAL | Status: DC

## 2013-07-16 MED ORDER — RIVAROXABAN 10 MG PO TABS: 10.0000 mg | ORAL_TABLET | ORAL | Status: DC

## 2013-07-16 MED ORDER — DSS 100 MG PO CAPS: 100.0000 mg | ORAL_CAPSULE | Freq: Two times a day (BID) | ORAL | Status: DC

## 2013-07-16 NOTE — Discharge Instructions (Signed)
Information on my medicine - XARELTO (Rivaroxaban)  This medication education was reviewed with me or my healthcare representative as part of my discharge preparation.  The pharmacist that spoke with me during my hospital stay was:  Lolita Patella, [place]  Why was Xarelto prescribed for you? Xarelto was prescribed for you to reduce the risk of blood clots forming after orthopedic surgery. The medical term for these abnormal blood clots is venous thromboembolism (VTE).  What do you need to know about xarelto ? Take your Xarelto ONCE DAILY at the same time every day. You may take it either with or without food.  If you have difficulty swallowing the tablet whole, you may crush it and mix in applesauce just prior to taking your dose.  Take Xarelto exactly as prescribed by your doctor and DO NOT stop taking Xarelto without talking to the doctor who prescribed the medication.  Stopping without other VTE prevention medication to take the place of Xarelto may increase your risk of developing a clot.  After discharge, you should have regular check-up appointments with your healthcare provider that is prescribing your Xarelto.    What do you do if you miss a dose? If you miss a dose, take it as soon as you remember on the same day then continue your regularly scheduled once daily regimen the next day. Do not take two doses of Xarelto on the same day.   Important Safety Information A possible side effect of Xarelto is bleeding. You should call your healthcare provider right away if you experience any of the following:   Bleeding from an injury or your nose that does not stop.   Unusual colored urine (red or dark brown) or unusual colored stools (red or black).   Unusual bruising for unknown reasons.   A serious fall or if you hit your head (even if there is no bleeding).  Some medicines may interact with Xarelto and might increase your risk of bleeding while on Xarelto. To help  avoid this, consult your healthcare provider or pharmacist prior to using any new prescription or non-prescription medications, including herbals, vitamins, non-steroidal anti-inflammatory drugs (NSAIDs) and supplements.  This website has more information on Xarelto: [email or web address]

## 2013-07-16 NOTE — Evaluation (Signed)
Physical Therapy Evaluation Patient Details Name: Janet Potts MRN: [id number] DOB: 13-Mar-1961 Today's Date: 07/16/2013   History of Present Illness     Clinical Impression  Pt s/p R THR presents with decreased R LE strength/ROM and post op pain limiting functional mobility.  Pt should progress to d/c home with family assist and HHPT follow up.    Follow Up Recommendations Home health PT    Equipment Recommendations  Rolling walker with 5" wheels    Recommendations for Other Services OT consult     Precautions / Restrictions Precautions Precautions: Fall Restrictions Weight Bearing Restrictions: No Other Position/Activity Restrictions: WBAT      Mobility  Bed Mobility Overal bed mobility: Needs Assistance Bed Mobility: Supine to Sit     Supine to sit: Min assist     General bed mobility comments: cues for sequence and use of L LE to self assist  Transfers Overall transfer level: Needs assistance Equipment used: Rolling walker (2 wheeled) Transfers: Sit to/from Stand Sit to Stand: Min assist;Mod assist         General transfer comment: cues for LE management and use of UEs to self assist  Ambulation/Gait Ambulation/Gait assistance: Min assist Ambulation Distance (Feet): 100 Feet Assistive device: Rolling walker (2 wheeled) Gait Pattern/deviations: Step-to pattern;Step-through pattern;Decreased step length - right;Decreased step length - left;Shuffle;Trunk flexed     General Gait Details: cues for posture, position from RW and initial sequence  Stairs            Wheelchair Mobility    Modified Rankin (Stroke Patients Only)       Balance                                             Pertinent Vitals/Pain 4/10; premed, ice pack provided    Home Living Family/patient expects to be discharged to:: Private residence Living Arrangements: Spouse/significant other Available Help at Discharge: Family Type of Home: House Home  Access: Stairs to enter Entrance Stairs-Rails: None Entrance Stairs-Number of Steps: 1 Home Layout: Two level Home Equipment: Walker - standard      Prior Function Level of Independence: Independent with assistive device(s);Independent               Hand Dominance   Dominant Hand: Right    Extremity/Trunk Assessment   Upper Extremity Assessment: Overall WFL for tasks assessed           Lower Extremity Assessment: RLE deficits/detail RLE Deficits / Details: Hip strength 2+/5 with AAROM at hip to 90 flex and 20 abd       Communication   Communication: No difficulties  Cognition Arousal/Alertness: Awake/alert Behavior During Therapy: WFL for tasks assessed/performed Overall Cognitive Status: Within Functional Limits for tasks assessed                      General Comments      Exercises Total Joint Exercises Ankle Circles/Pumps: AROM;Both;15 reps;Supine Quad Sets: AROM;Both;10 reps;Supine Heel Slides: AAROM;Right;15 reps;Supine Hip ABduction/ADduction: AAROM;Right;15 reps;Supine      Assessment/Plan    PT Assessment Patient needs continued PT services  PT Diagnosis Difficulty walking   PT Problem List Decreased strength;Decreased range of motion;Decreased activity tolerance;Decreased mobility;Decreased knowledge of use of DME;Pain  PT Treatment Interventions DME instruction;Gait training;Stair training;Functional mobility training;Therapeutic activities;Therapeutic exercise;Patient/family education   PT Goals (Current goals can be found in  the Care Plan section) Acute Rehab PT Goals Patient Stated Goal: Resume previous lifestyle with decreased pain PT Goal Formulation: With patient Time For Goal Achievement: 07/23/13 Potential to Achieve Goals: Good    Frequency 7X/week   Barriers to discharge        Co-evaluation               End of Session Equipment Utilized During Treatment: Gait belt Activity Tolerance: Patient tolerated  treatment well Patient left: in chair;with call bell/phone within reach;with family/visitor present Nurse Communication: Mobility status         Time: 7544-9201 PT Time Calculation (min): 31 min   Charges:   PT Evaluation $Initial PT Evaluation Tier I: 1 Procedure PT Treatments $Gait Training: 8-22 mins $Therapeutic Exercise: 8-22 mins   PT G Codes:          Mathis Fare 07/16/2013, 12:37 PM

## 2013-07-16 NOTE — Progress Notes (Signed)
[facility] TO PROVIDE HOME CARE AND DME SERVICES ,REP, IS AWARE AND ACCEPTED, PATIENT/

## 2013-07-16 NOTE — Progress Notes (Signed)
Pt has had extreme pain (crying, hyperventilating, holding right leg)  throughout the night requiring IV pain meds. Will leave Foley in this am until ready to get up with PT.

## 2013-07-16 NOTE — Progress Notes (Signed)
Physical Therapy Treatment Patient Details Name: Janet Potts MRN: [id number] DOB: 1960/11/15 Today's Date: 08-05-2013    History of Present Illness      PT Comments    Pt cooperative but ltd this pm by c/o "wooziness" with ambulation - BP 121/70  Follow Up Recommendations  Home health PT     Equipment Recommendations  Rolling walker with 5" wheels    Recommendations for Other Services OT consult     Precautions / Restrictions Precautions Precautions: Fall Restrictions Weight Bearing Restrictions: No Other Position/Activity Restrictions: WBAT    Mobility  Bed Mobility Overal bed mobility: Needs Assistance Bed Mobility: Sit to Supine       Sit to supine: Min assist   General bed mobility comments: cues for sequence and use of L LE to self assist  Transfers Overall transfer level: Needs assistance Equipment used: Rolling walker (2 wheeled) Transfers: Sit to/from Stand Sit to Stand: Min assist         General transfer comment: cues for LE management and use of UEs to self assist  Ambulation/Gait Ambulation/Gait assistance: Min assist Ambulation Distance (Feet): 82 Feet (and 5' from chair to bed) Assistive device: Rolling walker (2 wheeled) Gait Pattern/deviations: Step-to pattern;Decreased step length - left;Decreased stance time - right;Shuffle;Trunk flexed     General Gait Details: cues for posture, position from RW and initial sequence   Stairs            Wheelchair Mobility    Modified Rankin (Stroke Patients Only)       Balance                                    Cognition Arousal/Alertness: Awake/alert Behavior During Therapy: WFL for tasks assessed/performed Overall Cognitive Status: Within Functional Limits for tasks assessed                      Exercises      General Comments        Pertinent Vitals/Pain 7/10 with stinging pain in thigh.  RN aware and attending, ice packs provided    Home  Living                      Prior Function            PT Goals (current goals can now be found in the care plan section) Acute Rehab PT Goals Patient Stated Goal: Resume previous lifestyle with decreased pain PT Goal Formulation: With patient Time For Goal Achievement: 07/23/13 Potential to Achieve Goals: Good Progress towards PT goals: Progressing toward goals    Frequency  7X/week    PT Plan Current plan remains appropriate    Co-evaluation             End of Session Equipment Utilized During Treatment: Gait belt Activity Tolerance: Other (comment) (c/o "wooziness) Patient left: in bed;with call bell/phone within reach     Time: 1450-1513 PT Time Calculation (min): 23 min  Charges:  $Gait Training: 8-22 mins $Therapeutic Activity: 8-22 mins                    G Codes:      Mathis Fare 08/05/2013, 3:45 PM

## 2013-07-16 NOTE — Progress Notes (Signed)
[facility]  [facility] is providing the following services: Patient has both rw and commode at home. No dme needs at this time.   If patient discharges after hours, please call [phone].   Linward Headland 07/16/2013, 10:39 AM

## 2013-07-16 NOTE — Progress Notes (Signed)
   Subjective: 1 Day Post-Op Procedure(s) (LRB): RIGHT TOTAL HIP ARTHROPLASTY ANTERIOR APPROACH (Right)   Patient reports pain as mild, pain controlled now.  Incident during the night with severe muscle spasms, otherwise no events. Discussed surgery and the potential for muscle spasms. Looking forward to PT and seeing how she feels after.  If spasms and pain are controlled and she does well with PT she can d/c home.  Objective:   VITALS:   Filed Vitals:   07/16/13 0800  BP: 116/78  Pulse: 54  Temp: 97.7 F (36.5 C)  Resp: 16    Neurovascular intact Dorsiflexion/Plantar flexion intact Incision: dressing C/D/I No cellulitis present Compartment soft  LABS  Recent Labs  07/16/13 0458  HGB 11.7*  HCT 33.9*  WBC 16.6*  PLT 239     Recent Labs  07/16/13 0458  NA 138  K 5.3  BUN 8  CREATININE 0.57  GLUCOSE 147*     Assessment/Plan: 1 Day Post-Op Procedure(s) (LRB): RIGHT TOTAL HIP ARTHROPLASTY ANTERIOR APPROACH (Right) Foley cath d/c'ed Advance diet Up with therapy D/C IV fluids Discharge home with home health Follow up in 2 weeks at [facility]. Follow up with OLIN,Brindle Leyba D in 2 weeks.  Contact information:  [facility] [address], [place] [phone]    Expected ABLA  Treated with iron and will observe  Obese (BMI 30-39.9) Estimated body mass index is 30.9 kg/(m^2) as calculated from the following:   Height as of this encounter: 5\' 2"  (1.575 m).   Weight as of this encounter: 76.658 kg (169 lb). Patient also counseled that weight may inhibit the healing process Patient counseled that losing weight will help with future health issues       West Pugh. Solon Alban   PAC  07/16/2013, 9:46 AM

## 2013-07-16 NOTE — Progress Notes (Signed)
OT Cancellation Note  Patient Details Name: Janet Potts MRN: [id number] DOB: Feb 03, 1961   Cancelled Treatment:    Reason Eval/Treat Not Completed: Other (comment). Pt sleepy at this time.  Will return later or tomorrow am.   Lesle Chris 07/16/2013, 1:38 PM Lesle Chris, OTR/L [phone] 07/16/2013

## 2013-07-17 LAB — BASIC METABOLIC PANEL
BUN: 5 mg/dL — ABNORMAL LOW (ref 6–23)
CHLORIDE: 103 meq/L (ref 96–112)
CO2: 28 mEq/L (ref 19–32)
Calcium: 8.8 mg/dL (ref 8.4–10.5)
Creatinine, Ser: 0.58 mg/dL (ref 0.50–1.10)
GFR calc Af Amer: >90 mL/min (ref >90)
GFR calc non Af Amer: >90 mL/min (ref >90)
GLUCOSE: 119 mg/dL — AB (ref 70–99)
Potassium: 4.8 mEq/L (ref 3.7–5.3)
SODIUM: 138 meq/L (ref 137–147)

## 2013-07-17 LAB — CBC
HCT: 33.5 % — ABNORMAL LOW (ref 36.0–46.0)
Hemoglobin: 11.1 g/dL — ABNORMAL LOW (ref 12.0–15.0)
MCH: 28.8 pg (ref 26.0–34.0)
MCHC: 33.1 g/dL (ref 30.0–36.0)
MCV: 87 fL (ref 78.0–100.0)
PLATELETS: 243 10*3/uL (ref 150–400)
RBC: 3.85 MIL/uL — AB (ref 3.87–5.11)
RDW: 15.1 % (ref 11.5–15.5)
WBC: 16.1 10*3/uL — AB (ref 4.0–10.5)

## 2013-07-17 MED ORDER — OXYCODONE HCL 5 MG PO TABS: 5.0000 mg | ORAL_TABLET | Freq: Four times a day (QID) | ORAL | Status: DC | PRN

## 2013-07-17 NOTE — Progress Notes (Signed)
Pt stable, scripts, d/c instructions given with no questions/concerns voiced by pt or husband.  Pt transported via wheelchair to private vehicle by [place], Therapist, sports.

## 2013-07-17 NOTE — Progress Notes (Signed)
Physical Therapy Treatment Patient Details Name: Janet Potts MRN: [id number] DOB: 03-Jan-1961 Today's Date: 07/17/2013    History of Present Illness r datha    PT Comments    No  Issues w/ pain today. Ready for DC.  Follow Up Recommendations  Home health PT     Equipment Recommendations   (spouse to get wheels)    Recommendations for Other Services       Precautions / Restrictions Precautions Precautions: Fall    Mobility  Bed Mobility   Bed Mobility: Supine to Sit     Supine to sit: Supervision     General bed mobility comments: cues for sequence and ABLE TO slide legs around.  Transfers   Equipment used: Rolling walker (2 wheeled) Transfers: Sit to/from Stand Sit to Stand: Supervision;From elevated surface         General transfer comment: exta time to    Ambulation/Gait Ambulation/Gait assistance: Min guard Ambulation Distance (Feet): 100 Feet Assistive device: Rolling walker (2 wheeled)       General Gait Details: cues for posture, position from RW and initial sequence   Stairs Stairs: Yes Stairs assistance: Min guard Stair Management: One rail Right;Step to pattern;Forwards;With cane Number of Stairs: 5 General stair comments: spouse present.  Wheelchair Mobility    Modified Rankin (Stroke Patients Only)       Balance                                    Cognition Arousal/Alertness: Awake/alert                          Exercises Total Joint Exercises Ankle Circles/Pumps: AROM;Both;15 reps;Supine Quad Sets: AROM;Both;10 reps;Supine Short Arc Quad: AROM;Right;10 reps;Supine Heel Slides: AROM;Right;10 reps;Supine Hip ABduction/ADduction: AAROM;Right;15 reps;Supine Long Arc Quad: AROM;Right;10 reps;Seated    General Comments        Pertinent Vitals/Pain Thigh is sore.ice applied    Home Living                      Prior Function            PT Goals (current goals can now be found in  the care plan section) Progress towards PT goals: Progressing toward goals    Frequency       PT Plan Current plan remains appropriate    Co-evaluation             End of Session   Activity Tolerance: Patient tolerated treatment well Patient left: in bed;in chair;with call bell/phone within reach;with family/visitor present     Time: 0920-0950 PT Time Calculation (min): 30 min  Charges:  $Gait Training: 8-22 mins $Therapeutic Exercise: 8-22 mins                    G Codes:      Claretha Cooper 07/17/2013, 10:04 AM

## 2013-07-17 NOTE — Progress Notes (Signed)
   Subjective: 2 Days Post-Op Procedure(s) (LRB): RIGHT TOTAL HIP ARTHROPLASTY ANTERIOR APPROACH (Right)   Patient reports pain as mild, pain controlled. No events throughout the night. Feels better than she did last night, less muscle spasms. Ready to be discharged home.  Objective:   VITALS:   Filed Vitals:   07/17/13 0440  BP: 120/74  Pulse: 54  Temp: 98.1 F (36.7 C)  Resp: 16    Neurovascular intact Dorsiflexion/Plantar flexion intact Incision: dressing C/D/I No cellulitis present Compartment soft  LABS  Recent Labs  07/16/13 0458 07/17/13 0435  HGB 11.7* 11.1*  HCT 33.9* 33.5*  WBC 16.6* 16.1*  PLT 239 243     Recent Labs  07/16/13 0458 07/17/13 0435  NA 138 138  K 5.3 4.8  BUN 8 5*  CREATININE 0.57 0.58  GLUCOSE 147* 119*     Assessment/Plan: 2 Days Post-Op Procedure(s) (LRB): RIGHT TOTAL HIP ARTHROPLASTY ANTERIOR APPROACH (Right) Up with therapy Discharge home with home health Follow up in 2 weeks at [facility]. Follow up with OLIN,Hasten Sweitzer D in 2 weeks.  Contact information:  [facility] [address], [place] [phone]    Expected ABLA  Treated with iron and will observe  Obese (BMI 30-39.9) Estimated body mass index is 30.9 kg/(m^2) as calculated from the following:   Height as of this encounter: 5\' 2"  (1.575 m).   Weight as of this encounter: 76.658 kg (169 lb). Patient also counseled that weight may inhibit the healing process Patient counseled that losing weight will help with future health issues      West Pugh. Hatim Homann   PAC  07/17/2013, 7:36 AM

## 2013-07-17 NOTE — Progress Notes (Signed)
Assessment unchanged. Pt sleeping without distress. SRP, RN 

## 2013-07-17 NOTE — Evaluation (Signed)
Occupational Therapy Evaluation Patient Details Name: Janet Potts MRN: [id number] DOB: 1960-09-18 Today's Date: 2013/08/07    History of Present Illness r datha   Clinical Impression   Pt presents to OT with decreased I with ADL activity. All education complete.     Follow Up Recommendations  No OT follow up    Equipment Recommendations  None recommended by OT       Precautions / Restrictions Precautions Precautions: Fall Restrictions Weight Bearing Restrictions: No      Mobility Bed Mobility   Bed Mobility: Supine to Sit     Supine to sit: Supervision     General bed mobility comments: cues for sequence and ABLE TO slide legs around.  Transfers   Equipment used: Rolling walker (2 wheeled) Transfers: Sit to/from Stand Sit to Stand: Supervision;From elevated surface         General transfer comment: exta time to           ADL Overall ADL's : Needs assistance/impaired     Grooming: Set up;Standing   Upper Body Bathing: Set up;Sitting   Lower Body Bathing: Sit to/from stand;Minimal assistance   Upper Body Dressing : Set up;Sitting   Lower Body Dressing: Minimal assistance;Sit to/from stand   Toilet Transfer: Minimal assistance;Ambulation;Comfort height toilet   Toileting- Clothing Manipulation and Hygiene: Minimal assistance;Sit to/from stand       Functional mobility during ADLs: Minimal assistance;Rolling walker          Hand Dominance Right   Extremity/Trunk Assessment Upper Extremity Assessment Upper Extremity Assessment: Overall WFL for tasks assessed   Lower Extremity Assessment RLE Deficits / Details: Hip strength 2+/5 with AAROM at hip to 90 flex and 20 abd       Communication Communication Communication: No difficulties   Cognition Arousal/Alertness: Awake/alert Behavior During Therapy: WFL for tasks assessed/performed Overall Cognitive Status: Within Functional Limits for tasks assessed                                 Home Living Family/patient expects to be discharged to:: Private residence Living Arrangements: Spouse/significant other Available Help at Discharge: Family Type of Home: House Home Access: Stairs to enter Technical brewer of Steps: 1 Entrance Stairs-Rails: None Home Layout: Two level Alternate Level Stairs-Number of Steps: 7 Alternate Level Stairs-Rails: Right Bathroom Shower/Tub: Teacher, early years/pre: Standard     Home Equipment: Walker - standard          Prior Functioning/Environment Level of Independence: Independent with assistive device(s);Independent                          End of Session    Activity Tolerance: Patient tolerated treatment well Patient left: in chair;with family/visitor present   Time: 0945-1010   Charges:  OT General Charges $OT Visit: 1 Procedure OT Evaluation $Initial OT Evaluation Tier I: 1 Procedure OT Treatments $Self Care/Home Management : 8-22 mins G-Codes:    Betsy Pries 08/07/2013, 10:57 AM

## 2013-07-17 NOTE — Progress Notes (Signed)
Utilization review completed.  

## 2013-07-21 NOTE — Discharge Summary (Signed)
Physician Discharge Summary  Patient ID: Janet Potts MRN: [id number] DOB/AGE: February 18, 1961 53 y.o.  Admit date: 07/15/2013 Discharge date: 07/17/2013   Procedures:  Procedure(s) (LRB): RIGHT TOTAL HIP ARTHROPLASTY ANTERIOR APPROACH (Right)  Attending Physician:  Dr. Paralee Cancel   Admission Diagnoses:   Right hip OA / pain  Discharge Diagnoses:  Principal Problem:   S/P right THA, AA Active Problems:   Expected blood loss anemia   Obese  Past Medical History  Diagnosis Date  . Complication of anesthesia   . PONV (postoperative nausea and vomiting)     occ  . 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 Gastric bypass status for obesity 06  . Anxiety     anxiety  . Pneumonia     20's  . UTI (urinary tract infection)     ? irritated  . GERD (gastroesophageal reflux disease)     hx before gastric bypass  . Fibromyalgia     ?  Marland Kitchen Sinusitis   . PTSD (post-traumatic stress disorder)     due to numerous surgeries   . Chronic kidney disease     cyst on kindey   . Arthritis   . Cancer     hx of precancerous cells being removed   . Anemia     hx of at age 23     HPI: Janet Potts, 53 y.o. female, has a history of pain and functional disability in the right hip(s) due to arthritis and patient has failed non-surgical conservative treatments for greater than 12 weeks to include NSAID's and/or analgesics, corticosteriod injections, use of assistive devices and activity modification. Onset of symptoms was gradual starting years ago with gradually worsening course since that time.The patient noted no past surgery on the right hip(s). Patient currently rates pain in the right hip at 10 out of 10 with activity. Patient has night pain, worsening of pain with activity and weight bearing, trendelenberg gait, pain that interfers with activities of daily living and pain with passive range of motion. Patient has evidence of  periarticular osteophytes and joint space narrowing by imaging studies. This condition presents safety issues increasing the risk of falls. There is no current active infection. Risks, benefits and expectations were discussed with the patient. Risks including but not limited to the risk of anesthesia, blood clots, nerve damage, blood vessel damage, failure of the prosthesis, infection and up to and including death. Patient understand the risks, benefits and expectations and wishes to proceed with surgery.   PCP: Cher Nakai, MD   Discharged Condition: good  Hospital Course:  Patient underwent the above stated procedure on 07/15/2013. Patient tolerated the procedure well and brought to the recovery room in good condition and subsequently to the floor.  POD #1 BP: 116/78 ; Pulse: 54 ; Temp: 97.7 F (36.5 C) ; Resp: 16 Patient reports pain as mild, pain controlled now. Incident during the night with severe muscle spasms, otherwise no events. Discussed surgery and the potential for muscle spasms. Looking forward to PT and seeing how she feels after.  Neurovascular intact, dorsiflexion/plantar flexion intact, incision: dressing C/D/I, no cellulitis present and compartment soft.   LABS  Basename    HGB  11.7  HCT  33.9   POD #2  BP: 120/74 ; Pulse: 54 ; Temp: 98.1 F (36.7 C) ; Resp: 16  Patient reports pain as mild, pain controlled. No events throughout the night. Feels better than she did last  night, less muscle spasms. Ready to be discharged home. Neurovascular intact, dorsiflexion/plantar flexion intact, incision: dressing C/D/I, no cellulitis present and compartment soft.   LABS  Basename    HGB  11.1  HCT  33.5    Discharge Exam: General appearance: alert, cooperative and no distress Extremities: Homans sign is negative, no sign of DVT, no edema, redness or tenderness in the calves or thighs and no ulcers, gangrene or trophic changes  Disposition: Home with follow up in 2  weeks   Follow-up Information   Follow up with Mauri Pole, MD. Schedule an appointment as soon as possible for a visit in 2 weeks.   Specialty:  Orthopedic Surgery   Contact information:   [address] [place] [address] [place] [address] [phone]       Discharge Orders   Future Orders Complete By Expires   Call MD / Call 911  As directed    Change dressing  As directed    Constipation Prevention  As directed    Diet - low sodium heart healthy  As directed    Discharge instructions  As directed    Driving restrictions  As directed    Increase activity slowly as tolerated  As directed    TED hose  As directed    Weight bearing as tolerated  As directed    Questions:     Laterality:     Extremity:          Medication List    STOP taking these medications       amoxicillin-clavulanate 500-125 MG per tablet  Commonly known as:  AUGMENTIN     HYDROcodone-acetaminophen 7.5-500 MG per tablet  Commonly known as:  LORTAB  Replaced by:  HYDROcodone-acetaminophen 7.5-325 MG per tablet     orphenadrine 100 MG tablet  Commonly known as:  NORFLEX      TAKE these medications       ALPRAZolam 0.5 MG tablet  Commonly known as:  XANAX  Take 0.5 mg by mouth daily as needed for anxiety.     CALCIUM 600+D3 600-800 MG-UNIT Tabs  Generic drug:  Calcium Carb-Cholecalciferol  Take 1 tablet by mouth daily.     calcium-vitamin D 500-200 MG-UNIT per tablet  Commonly known as:  OSCAL WITH D  Take 1 tablet by mouth.     DSS 100 MG Caps  Take 100 mg by mouth 2 (two) times daily.     estrogen-methylTESTOSTERone 1.25-2.5 MG per tablet  Commonly known as:  ESTRATEST  Take 1 tablet by mouth every evening.     ferrous sulfate 325 (65 FE) MG tablet  Take 1 tablet (325 mg total) by mouth 3 (three) times daily after meals.     GAS-X PO  Take 180 mg by mouth daily as needed (flatulence).     HYDROcodone-acetaminophen 7.5-325 MG per tablet  Commonly known as:  NORCO   Take 1-2 tablets by mouth every 4 (four) hours as needed for moderate pain.     KRILL OIL OMEGA-3 PO  Take 1 tablet by mouth daily.     magnesium chloride 64 MG Tbec SR tablet  Commonly known as:  SLOW-MAG  Take 1 tablet by mouth daily.     methocarbamol 500 MG tablet  Commonly known as:  ROBAXIN  Take 1 tablet (500 mg total) by mouth every 6 (six) hours as needed for muscle spasms.     multivitamin with minerals Tabs tablet  Take 1 tablet by mouth daily. pateint  takes gummy chewable     ondansetron 4 MG tablet  Commonly known as:  ZOFRAN  Take 4 mg by mouth every 8 (eight) hours as needed for nausea.     oxyCODONE 5 MG immediate release tablet  Commonly known as:  Oxy IR/ROXICODONE  Take 1-3 tablets (5-15 mg total) by mouth every 6 (six) hours as needed for severe pain.     polyethylene glycol packet  Commonly known as:  MIRALAX / GLYCOLAX  Take 17 g by mouth 2 (two) times daily.     rivaroxaban 10 MG Tabs tablet  Commonly known as:  XARELTO  Take 1 tablet (10 mg total) by mouth daily.     VITAMIN B-12 SL  Place 2,400 mcg under the tongue daily.     vitamin C 500 MG tablet  Commonly known as:  ASCORBIC ACID  Take 1,000 mg by mouth daily. pateint takes gummy chewable         Signed: West Pugh. Uchenna Rappaport   PAC  07/21/2013, 4:57 PM

## 2014-09-21 ENCOUNTER — Other Ambulatory Visit: Payer: Self-pay | Admitting: Obstetrics & Gynecology

## 2014-09-22 LAB — CYTOLOGY - PAP

## 2015-03-07 ENCOUNTER — Encounter (HOSPITAL_COMMUNITY): Payer: Self-pay | Admitting: *Deleted

## 2015-03-07 ENCOUNTER — Emergency Department (HOSPITAL_COMMUNITY): Payer: 59

## 2015-03-07 ENCOUNTER — Emergency Department (HOSPITAL_COMMUNITY)
Admission: EM | Admit: 2015-03-07 | Discharge: 2015-03-07 | Disposition: A | Discharge status: Home / Self Care | Payer: 59 | Attending: Emergency Medicine | Admitting: Emergency Medicine

## 2015-03-07 DIAGNOSIS — Z859 Personal history of malignant neoplasm, unspecified: Secondary | ICD-10-CM | POA: Insufficient documentation

## 2015-03-07 DIAGNOSIS — Z87440 Personal history of urinary (tract) infections: Secondary | ICD-10-CM | POA: Insufficient documentation

## 2015-03-07 DIAGNOSIS — M542 Cervicalgia: Secondary | ICD-10-CM | POA: Diagnosis not present

## 2015-03-07 DIAGNOSIS — Z981 Arthrodesis status: Secondary | ICD-10-CM | POA: Insufficient documentation

## 2015-03-07 DIAGNOSIS — R220 Localized swelling, mass and lump, head: Secondary | ICD-10-CM | POA: Insufficient documentation

## 2015-03-07 DIAGNOSIS — Z87738 Personal history of other specified (corrected) congenital malformations of digestive system: Secondary | ICD-10-CM | POA: Diagnosis not present

## 2015-03-07 DIAGNOSIS — R0600 Dyspnea, unspecified: Secondary | ICD-10-CM | POA: Diagnosis not present

## 2015-03-07 DIAGNOSIS — Z7901 Long term (current) use of anticoagulants: Secondary | ICD-10-CM | POA: Diagnosis not present

## 2015-03-07 DIAGNOSIS — K219 Gastro-esophageal reflux disease without esophagitis: Secondary | ICD-10-CM | POA: Insufficient documentation

## 2015-03-07 DIAGNOSIS — Z9884 Bariatric surgery status: Secondary | ICD-10-CM | POA: Insufficient documentation

## 2015-03-07 DIAGNOSIS — Z8701 Personal history of pneumonia (recurrent): Secondary | ICD-10-CM | POA: Diagnosis not present

## 2015-03-07 DIAGNOSIS — D649 Anemia, unspecified: Secondary | ICD-10-CM | POA: Insufficient documentation

## 2015-03-07 DIAGNOSIS — Z8709 Personal history of other diseases of the respiratory system: Secondary | ICD-10-CM | POA: Diagnosis not present

## 2015-03-07 DIAGNOSIS — R221 Localized swelling, mass and lump, neck: Secondary | ICD-10-CM | POA: Insufficient documentation

## 2015-03-07 DIAGNOSIS — G8929 Other chronic pain: Secondary | ICD-10-CM | POA: Diagnosis not present

## 2015-03-07 DIAGNOSIS — Z79899 Other long term (current) drug therapy: Secondary | ICD-10-CM | POA: Diagnosis not present

## 2015-03-07 DIAGNOSIS — M549 Dorsalgia, unspecified: Secondary | ICD-10-CM | POA: Diagnosis not present

## 2015-03-07 DIAGNOSIS — N189 Chronic kidney disease, unspecified: Secondary | ICD-10-CM | POA: Diagnosis not present

## 2015-03-07 DIAGNOSIS — F4310 Post-traumatic stress disorder, unspecified: Secondary | ICD-10-CM | POA: Diagnosis not present

## 2015-03-07 DIAGNOSIS — M1990 Unspecified osteoarthritis, unspecified site: Secondary | ICD-10-CM | POA: Diagnosis not present

## 2015-03-07 DIAGNOSIS — F419 Anxiety disorder, unspecified: Secondary | ICD-10-CM | POA: Insufficient documentation

## 2015-03-07 LAB — CBC WITH DIFFERENTIAL/PLATELET
Basophils Absolute: 0.1 10*3/uL (ref 0.0–0.1)
Basophils Relative: 1 %
EOS ABS: 0.1 10*3/uL (ref 0.0–0.7)
EOS PCT: 1 %
HCT: 34.7 % — ABNORMAL LOW (ref 36.0–46.0)
HEMOGLOBIN: 11.1 g/dL — AB (ref 12.0–15.0)
LYMPHS ABS: 4.4 10*3/uL — AB (ref 0.7–4.0)
Lymphocytes Relative: 44 %
MCH: 25.6 pg — AB (ref 26.0–34.0)
MCHC: 32 g/dL (ref 30.0–36.0)
MCV: 80.1 fL (ref 78.0–100.0)
MONOS PCT: 8 %
Monocytes Absolute: 0.8 10*3/uL (ref 0.1–1.0)
Neutro Abs: 4.7 10*3/uL (ref 1.7–7.7)
Neutrophils Relative %: 46 %
Platelets: 315 10*3/uL (ref 150–400)
RBC: 4.33 MIL/uL (ref 3.87–5.11)
RDW: 16.8 % — ABNORMAL HIGH (ref 11.5–15.5)
WBC: 10 10*3/uL (ref 4.0–10.5)

## 2015-03-07 LAB — BASIC METABOLIC PANEL
Anion gap: 6 (ref 5–15)
BUN: 14 mg/dL (ref 6–20)
CO2: 30 mmol/L (ref 22–32)
Calcium: 8.6 mg/dL — ABNORMAL LOW (ref 8.9–10.3)
Chloride: 104 mmol/L (ref 101–111)
Creatinine, Ser: 0.85 mg/dL (ref 0.44–1.00)
GFR calc Af Amer: >60 mL/min (ref >60)
GLUCOSE: 100 mg/dL — AB (ref 65–99)
Potassium: 4.2 mmol/L (ref 3.5–5.1)
Sodium: 140 mmol/L (ref 135–145)

## 2015-03-07 MED ORDER — METHOCARBAMOL 500 MG PO TABS: 500.0000 mg | ORAL_TABLET | Freq: Two times a day (BID) | ORAL | Status: DC

## 2015-03-07 MED ORDER — HYDROCODONE-ACETAMINOPHEN 10-325 MG PO TABS: 1.0000 | ORAL_TABLET | Freq: Four times a day (QID) | ORAL | Status: DC | PRN

## 2015-03-07 MED ORDER — LORAZEPAM 2 MG/ML IJ SOLN
1.0000 mg | Freq: Once | INTRAMUSCULAR | Status: AC
  Administered 2015-03-07: 1 mg via INTRAVENOUS
  Filled 2015-03-07: qty 1

## 2015-03-07 MED ORDER — HYDROMORPHONE HCL 1 MG/ML IJ SOLN
1.0000 mg | Freq: Once | INTRAMUSCULAR | Status: AC
  Administered 2015-03-07: 1 mg via INTRAVENOUS
  Filled 2015-03-07: qty 1

## 2015-03-07 NOTE — ED Notes (Signed)
EDP at bedside  

## 2015-03-07 NOTE — ED Provider Notes (Signed)
CSN: [id number]     Arrival date & time 03/07/15  1143 History   First MD Initiated Contact with Patient 03/07/15 1232     Chief Complaint  Patient presents with  . Leg Swelling  . Back Pain     (Consider location/radiation/quality/duration/timing/severity/associated sxs/prior Treatment) HPI Comments: Patient is a 54 year old female with history of prior cervical spine fusion 2 performed by Dr. Trenton Gammon. She has a history of chronic neck pain and has had recent medication changes attempting to manage her pain. She is recently been started on gabapentin and her Xanax was stopped. She presents here today complaining of severe neck pain and feeling as though she cannot breathe. She reports swelling in her face neck and throat. She was seen by her surgeon on Friday and had x-rays of her neck performed which revealed no acute abnormality. She is convinced that there is a problem in her neck and is requesting an MRI. She also states that she is having difficulty catching her breath, feels numb in her hands and face.  The history is provided by the patient.    Past Medical History  Diagnosis Date  . Complication of anesthesia   . PONV (postoperative nausea and vomiting)     occ  . 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 Gastric bypass status for obesity 06  . Anxiety     anxiety  . Pneumonia     20's  . UTI (urinary tract infection)     ? irritated  . GERD (gastroesophageal reflux disease)     hx before gastric bypass  . Fibromyalgia     ?  Marland Kitchen Sinusitis   . PTSD (post-traumatic stress disorder)     due to numerous surgeries   . Chronic kidney disease     cyst on kindey   . Arthritis   . Cancer (HCC)     hx of precancerous cells being removed   . Anemia     hx of at age 68    Past Surgical History  Procedure Laterality Date  . Gastic  06    bypass  . Abdominal hysterectomy  98,08    partial, oophorectomy bil  .  Tonsillectomy  74  . Ankle arthroscopy Right 99    cartilage  . Sinus exploration  00  . Cholecystectomy  07  . Appendectomy  07  . Gastricsurgery  10    repair intestinal rupture  . Cervical disc arthroplasty N/A 05/31/2012    Procedure: CERVICAL ANTERIOR DISC ARTHROPLASTY;  Surgeon: Charlie Pitter, MD;  Location: [place] [facility];  Service: Neurosurgery;  Laterality: N/A;  C-Arm  . Ankle surgery      left ankle with titanium plate   . Gastric surgeyr       for intestinal blockage due to pouch rupturing   . Total hip arthroplasty Right 07/15/2013    Procedure: RIGHT TOTAL HIP ARTHROPLASTY ANTERIOR APPROACH;  Surgeon: Mauri Pole, MD;  Location: [facility];  Service: Orthopedics;  Laterality: Right;   History reviewed. No pertinent family history. Social History  Substance Use Topics  . Smoking status: Never Smoker   . Smokeless tobacco: Never Used  . Alcohol Use: 3.4 oz/week    4 Glasses of wine, 2 Standard drinks or equivalent per week   OB History    No data available     Review of Systems  All other systems reviewed and are negative.  Allergies  Cymbalta; Other; Silvadene; Sulfa antibiotics; Zanaflex; Adhesive; and Avelox  Home Medications   Prior to Admission medications   Medication Sig Start Date End Date Taking? Authorizing Provider  ALPRAZolam Duanne Moron) 0.5 MG tablet Take 0.5 mg by mouth daily as needed for anxiety.     Historical Provider, MD  Calcium Carb-Cholecalciferol (CALCIUM 600+D3) 600-800 MG-UNIT TABS Take 1 tablet by mouth daily.    Historical Provider, MD  calcium-vitamin D (OSCAL WITH D) 500-200 MG-UNIT per tablet Take 1 tablet by mouth.    Historical Provider, MD  Cyanocobalamin (VITAMIN B-12 SL) Place 2,400 mcg under the tongue daily.    Historical Provider, MD  docusate sodium 100 MG CAPS Take 100 mg by mouth 2 (two) times daily. 07/16/13   Danae Orleans, PA-C  estrogen-methylTESTOSTERone (ESTRATEST) 1.25-2.5 MG per tablet Take 1 tablet by mouth every  evening.     Historical Provider, MD  ferrous sulfate 325 (65 FE) MG tablet Take 1 tablet (325 mg total) by mouth 3 (three) times daily after meals. 07/16/13   Danae Orleans, PA-C  HYDROcodone-acetaminophen (NORCO) 7.5-325 MG per tablet Take 1-2 tablets by mouth every 4 (four) hours as needed for moderate pain. 07/16/13   Danae Orleans, PA-C  KRILL OIL OMEGA-3 PO Take 1 tablet by mouth daily.    Historical Provider, MD  magnesium chloride (SLOW-MAG) 64 MG TBEC Take 1 tablet by mouth daily.    Historical Provider, MD  methocarbamol (ROBAXIN) 500 MG tablet Take 1 tablet (500 mg total) by mouth every 6 (six) hours as needed for muscle spasms. 07/16/13   Danae Orleans, PA-C  Multiple Vitamin (MULTIVITAMIN WITH MINERALS) TABS Take 1 tablet by mouth daily. pateint takes gummy chewable    Historical Provider, MD  ondansetron (ZOFRAN) 4 MG tablet Take 4 mg by mouth every 8 (eight) hours as needed for nausea.    Historical Provider, MD  oxyCODONE (OXY IR/ROXICODONE) 5 MG immediate release tablet Take 1-3 tablets (5-15 mg total) by mouth every 6 (six) hours as needed for severe pain. 07/17/13   Paralee Cancel, MD  polyethylene glycol [facility] / Floria Raveling) packet Take 17 g by mouth 2 (two) times daily. 07/16/13   Danae Orleans, PA-C  rivaroxaban (XARELTO) 10 MG TABS tablet Take 1 tablet (10 mg total) by mouth daily. 07/16/13   Danae Orleans, PA-C  Simethicone (GAS-X PO) Take 180 mg by mouth daily as needed (flatulence).     Historical Provider, MD  vitamin C (ASCORBIC ACID) 500 MG tablet Take 1,000 mg by mouth daily. pateint takes gummy chewable    Historical Provider, MD   BP 199/108 mmHg  Pulse 61  Temp(Src) 98.9 F (37.2 C) (Oral)  Resp 18  SpO2 100% Physical Exam  Constitutional: She is oriented to person, place, and time. She appears well-developed and well-nourished.  Patient appears extremely anxious. She is tearful and sobbing and hyperventilating.  HENT:  Head: Normocephalic and atraumatic.  Neck:  Normal range of motion. Neck supple.  The prior surgical incision is noted to the anterior neck. It appears to be healing well. She has tenderness to palpation in the soft tissues of the cervical region. There is no bony tenderness and no step-off.  Cardiovascular: Normal rate and regular rhythm.  Exam reveals no gallop and no friction rub.   No murmur heard. Pulmonary/Chest: Effort normal and breath sounds normal. No respiratory distress. She has no wheezes.  Abdominal: Soft. Bowel sounds are normal. She exhibits no distension. There is no tenderness.  Musculoskeletal:  Normal range of motion. She exhibits no edema.  There is no swelling of the hands, arms, or neck that I can appreciate.  Neurological: She is alert and oriented to person, place, and time. She exhibits abnormal muscle tone. Coordination normal.  Strength is 5 out of 5 in the bilateral upper extremities. Coordination is normal.  Skin: Skin is warm and dry.  Nursing note and vitals reviewed.   ED Course  Procedures (including critical care time) Labs Review Labs Reviewed  BASIC METABOLIC PANEL  CBC WITH DIFFERENTIAL/PLATELET    Imaging Review No results found. I have personally reviewed and evaluated these images and lab results as part of my medical decision-making.   EKG Interpretation   Date/Time:  Sunday March 07 2015 15:41:18 EST Ventricular Rate:  52 PR Interval:  143 QRS Duration: 92 QT Interval:  436 QTC Calculation: 405 R Axis:   -15 Text Interpretation:  Sinus rhythm Borderline left axis deviation Low  voltage, precordial leads Confirmed by Cora Brierley  MD, Shontell Prosser ([id number]) on  03/07/2015 3:50:42 PM      MDM   Final diagnoses:  None    Patient is a 54 year old female with history of prior neck surgeries and chronic neck pain. She presents here for evaluation of the above complaints. The patient is extremely anxious and emotional and I suspect that this is a large component of her discomfort. I found  nothing on the physical examination that appears out of the ordinary. I've also found nothing on her MRI or cardiac workup to explain her complaints. She is convinced that her gabapentin is causing her to have a reaction. She is adamant about going off of this. I did advise her to speak with her prescribing doctor prior to doing this. She is requested additional pain medication which I've agreed to write. She is to call and follow-up with her neurosurgeon tomorrow.    Veryl Speak, MD 03/07/15 [phone]

## 2015-03-07 NOTE — ED Notes (Signed)
Pt reports hx of multiple back and neck surgery. Has chronic pain and goes to pain management, recently started on gabapentin. Reports recent injury to head/neck and now having increase in pain to neck and back. Now has swelling to entire body. Pt very anxious at triage.

## 2015-03-07 NOTE — ED Notes (Addendum)
Patient anxious and crying.  States took benadryl and hydrocodone 5mg  prior to arrival today.

## 2015-03-07 NOTE — Discharge Instructions (Signed)
Hydrocodone and Robaxin as prescribed as needed for pain.  Follow-up with your neurosurgeon tomorrow to discuss your situation.   Musculoskeletal Pain Musculoskeletal pain is muscle and boney aches and pains. These pains can occur in any part of the body. Your caregiver may treat you without knowing the cause of the pain. They may treat you if blood or urine tests, X-rays, and other tests were normal.  CAUSES There is often not a definite cause or reason for these pains. These pains may be caused by a type of germ (virus). The discomfort may also come from overuse. Overuse includes working out too hard when your body is not fit. Boney aches also come from weather changes. Bone is sensitive to atmospheric pressure changes. HOME CARE INSTRUCTIONS   Ask when your test results will be ready. Make sure you get your test results.  Only take over-the-counter or prescription medicines for pain, discomfort, or fever as directed by your caregiver. If you were given medications for your condition, do not drive, operate machinery or power tools, or sign legal documents for 24 hours. Do not drink alcohol. Do not take sleeping pills or other medications that may interfere with treatment.  Continue all activities unless the activities cause more pain. When the pain lessens, slowly resume normal activities. Gradually increase the intensity and duration of the activities or exercise.  During periods of severe pain, bed rest may be helpful. Lay or sit in any position that is comfortable.  Putting ice on the injured area.  Put ice in a bag.  Place a towel between your skin and the bag.  Leave the ice on for 15 to 20 minutes, 3 to 4 times a day.  Follow up with your caregiver for continued problems and no reason can be found for the pain. If the pain becomes worse or does not go away, it may be necessary to repeat tests or do additional testing. Your caregiver may need to look further for a possible  cause. SEEK IMMEDIATE MEDICAL CARE IF:  You have pain that is getting worse and is not relieved by medications.  You develop chest pain that is associated with shortness or breath, sweating, feeling sick to your stomach (nauseous), or throw up (vomit).  Your pain becomes localized to the abdomen.  You develop any new symptoms that seem different or that concern you. MAKE SURE YOU:   Understand these instructions.  Will watch your condition.  Will get help right away if you are not doing well or get worse.   This information is not intended to replace advice given to you by your health care provider. Make sure you discuss any questions you have with your health care provider.   Document Released: 03/20/2005 Document Revised: 06/12/2011 Document Reviewed: 11/22/2012 Elsevier Interactive Patient Education [employer].

## 2015-08-31 ENCOUNTER — Other Ambulatory Visit (HOSPITAL_COMMUNITY): Payer: Self-pay | Admitting: *Deleted

## 2015-08-31 NOTE — Patient Instructions (Addendum)
Janet Potts  08/31/2015   Your procedure is scheduled on: 09-14-15  Report to [facility] Main  Entrance take [facility]  elevators to 3rd floor to  [place] at 830 AM.  Call this number if you have problems the morning of surgery [phone]   Remember: ONLY 1 PERSON MAY GO WITH YOU TO SHORT STAY TO GET  READY MORNING OF [place].  Do not eat food or drink liquids :After Midnight.  Short stay will draw your blood type morning of your surgery   Take these medicines the morning of surgery with A SIP OF WATER: alprazolam (xanax)  if needed),oxycodone if needed, zofran if needed, protonix              You may not have any metal on your body including hair pins and              piercings  Do not wear jewelry, make-up, lotions, powders or perfumes, deodorant             Do not wear nail polish.  Do not shave  48 hours prior to surgery.              Men may shave face and neck.   Do not bring valuables to the hospital. [place].  Contacts, dentures or bridgework may not be worn into surgery.  Leave suitcase in the car. After surgery it may be brought to your room.                Please read over the following fact sheets you were given: _____________________________________________________________________             [facility] - Preparing for Surgery Before surgery, you can play an important role.  Because skin is not sterile, your skin needs to be as free of germs as possible.  You can reduce the number of germs on your skin by washing with CHG (chlorahexidine gluconate) soap before surgery.  CHG is an antiseptic cleaner which kills germs and bonds with the skin to continue killing germs even after washing. Please DO NOT use if you have an allergy to CHG or antibacterial soaps.  If your skin becomes reddened/irritated stop using the CHG and inform your nurse when you arrive at Short Stay. Do not shave  (including legs and underarms) for at least 48 hours prior to the first CHG shower.  You may shave your face/neck. Please follow these instructions carefully:  1.  Shower with CHG Soap the night before surgery and the  morning of Surgery.  2.  If you choose to wash your hair, wash your hair first as usual with your  normal  shampoo.  3.  After you shampoo, rinse your hair and body thoroughly to remove the  shampoo.                           4.  Use CHG as you would any other liquid soap.  You can apply chg directly  to the skin and wash                       Gently with a scrungie or clean washcloth.  5.  Apply the CHG Soap to  your body ONLY FROM THE NECK DOWN.   Do not use on face/ open                           Wound or open sores. Avoid contact with eyes, ears mouth and genitals (private parts).                       Wash face,  Genitals (private parts) with your normal soap.             6.  Wash thoroughly, paying special attention to the area where your surgery  will be performed.  7.  Thoroughly rinse your body with warm water from the neck down.  8.  DO NOT shower/wash with your normal soap after using and rinsing off  the CHG Soap.                9.  Pat yourself dry with a clean towel.            10.  Wear clean pajamas.            11.  Place clean sheets on your bed the night of your first shower and do not  sleep with pets. Day of Surgery : Do not apply any lotions/deodorants the morning of surgery.  Please wear clean clothes to the hospital/surgery center.  FAILURE TO FOLLOW THESE INSTRUCTIONS MAY RESULT IN THE CANCELLATION OF YOUR SURGERY PATIENT SIGNATURE_________________________________  NURSE SIGNATURE__________________________________  ________________________________________________________________________   Adam Phenix  An incentive spirometer is a tool that can help keep your lungs clear and active. This tool measures how well you are filling your lungs with  each breath. Taking long deep breaths may help reverse or decrease the chance of developing breathing (pulmonary) problems (especially infection) following:  A long period of time when you are unable to move or be active. BEFORE THE PROCEDURE   If the spirometer includes an indicator to show your best effort, your nurse or respiratory therapist will set it to a desired goal.  If possible, sit up straight or lean slightly forward. Try not to slouch.  Hold the incentive spirometer in an upright position. INSTRUCTIONS FOR USE   Sit on the edge of your bed if possible, or sit up as far as you can in bed or on a chair.  Hold the incentive spirometer in an upright position.  Breathe out normally.  Place the mouthpiece in your mouth and seal your lips tightly around it.  Breathe in slowly and as deeply as possible, raising the piston or the ball toward the top of the column.  Hold your breath for 3-5 seconds or for as long as possible. Allow the piston or ball to fall to the bottom of the column.  Remove the mouthpiece from your mouth and breathe out normally.  Rest for a few seconds and repeat Steps 1 through 7 at least 10 times every 1-2 hours when you are awake. Take your time and take a few normal breaths between deep breaths.  The spirometer may include an indicator to show your best effort. Use the indicator as a goal to work toward during each repetition.  After each set of 10 deep breaths, practice coughing to be sure your lungs are clear. If you have an incision (the cut made at the time of surgery), support your incision when coughing by placing a pillow or rolled up towels firmly against  it. Once you are able to get out of bed, walk around indoors and cough well. You may stop using the incentive spirometer when instructed by your caregiver.  RISKS AND COMPLICATIONS  Take your time so you do not get dizzy or light-headed.  If you are in pain, you may need to take or ask for  pain medication before doing incentive spirometry. It is harder to take a deep breath if you are having pain. AFTER USE  Rest and breathe slowly and easily.  It can be helpful to keep track of a log of your progress. Your caregiver can provide you with a simple table to help with this. If you are using the spirometer at home, follow these instructions: [place] IF:   You are having difficultly using the spirometer.  You have trouble using the spirometer as often as instructed.  Your pain medication is not giving enough relief while using the spirometer.  You develop fever of 100.5 F (38.1 C) or higher. SEEK IMMEDIATE MEDICAL CARE IF:   You cough up bloody sputum that had not been present before.  You develop fever of 102 F (38.9 C) or greater.  You develop worsening pain at or near the incision site. MAKE SURE YOU:   Understand these instructions.  Will watch your condition.  Will get help right away if you are not doing well or get worse. Document Released: 07/31/2006 Document Revised: 06/12/2011 Document Reviewed: 10/01/2006 Exit[employer] Patient Information 2014 [employer], [place].   ________________________________________________________________________  WHAT IS A BLOOD TRANSFUSION? Blood Transfusion Information  A transfusion is the replacement of blood or some of its parts. Blood is made up of multiple cells which provide different functions.  Red blood cells carry oxygen and are used for blood loss replacement.  White blood cells fight against infection.  Platelets control bleeding.  Plasma helps clot blood.  Other blood products are available for specialized needs, such as hemophilia or other clotting disorders. BEFORE THE TRANSFUSION  Who gives blood for transfusions?   Healthy volunteers who are fully evaluated to make sure their blood is safe. This is blood bank blood. Transfusion therapy is the safest it has ever been in the practice of medicine.  Before blood is taken from a donor, a complete history is taken to make sure that person has no history of diseases nor engages in risky social behavior (examples are intravenous drug use or sexual activity with multiple partners). The donor's travel history is screened to minimize risk of transmitting infections, such as malaria. The donated blood is tested for signs of infectious diseases, such as HIV and hepatitis. The blood is then tested to be sure it is compatible with you in order to minimize the chance of a transfusion reaction. If you or a relative donates blood, this is often done in anticipation of surgery and is not appropriate for emergency situations. It takes many days to process the donated blood. RISKS AND COMPLICATIONS Although transfusion therapy is very safe and saves many lives, the main dangers of transfusion include:   Getting an infectious disease.  Developing a transfusion reaction. This is an allergic reaction to something in the blood you were given. Every precaution is taken to prevent this. The decision to have a blood transfusion has been considered carefully by your caregiver before blood is given. Blood is not given unless the benefits outweigh the risks. AFTER THE TRANSFUSION  Right after receiving a blood transfusion, you will usually feel much better and more  energetic. This is especially true if your red blood cells have gotten low (anemic). The transfusion raises the level of the red blood cells which carry oxygen, and this usually causes an energy increase.  The nurse administering the transfusion will monitor you carefully for complications. HOME CARE INSTRUCTIONS  No special instructions are needed after a transfusion. You may find your energy is better. Speak with your caregiver about any limitations on activity for underlying diseases you may have. SEEK MEDICAL CARE IF:   Your condition is not improving after your transfusion.  You develop redness or  irritation at the intravenous (IV) site. SEEK IMMEDIATE MEDICAL CARE IF:  Any of the following symptoms occur over the next 12 hours:  Shaking chills.  You have a temperature by mouth above 102 F (38.9 C), not controlled by medicine.  Chest, back, or muscle pain.  People around you feel you are not acting correctly or are confused.  Shortness of breath or difficulty breathing.  Dizziness and fainting.  You get a rash or develop hives.  You have a decrease in urine output.  Your urine turns a dark color or changes to pink, red, or brown. Any of the following symptoms occur over the next 10 days:  You have a temperature by mouth above 102 F (38.9 C), not controlled by medicine.  Shortness of breath.  Weakness after normal activity.  The white part of the eye turns yellow (jaundice).  You have a decrease in the amount of urine or are urinating less often.  Your urine turns a dark color or changes to pink, red, or brown. Document Released: 03/17/2000 Document Revised: 06/12/2011 Document Reviewed: 11/04/2007 [facility] Patient Information 2014 [place], [place].  _______________________________________________________________________

## 2015-08-31 NOTE — Progress Notes (Signed)
ekg 03-07-15 epic  chest xray 03-07-15 epic

## 2015-09-01 ENCOUNTER — Encounter (HOSPITAL_COMMUNITY): Payer: Self-pay

## 2015-09-01 ENCOUNTER — Encounter (HOSPITAL_COMMUNITY)
Admission: RE | Admit: 2015-09-01 | Discharge: 2015-09-01 | Disposition: A | Discharge status: Home / Self Care | Payer: 59 | Source: Ambulatory Visit | Attending: Orthopedic Surgery | Admitting: Orthopedic Surgery

## 2015-09-01 DIAGNOSIS — Z01812 Encounter for preprocedural laboratory examination: Secondary | ICD-10-CM | POA: Insufficient documentation

## 2015-09-01 DIAGNOSIS — M1612 Unilateral primary osteoarthritis, left hip: Secondary | ICD-10-CM | POA: Diagnosis not present

## 2015-09-01 HISTORY — DX: Other chronic pain: G89.29

## 2015-09-01 HISTORY — DX: Constipation, unspecified: K59.00

## 2015-09-01 HISTORY — DX: Fracture of neck, unspecified, initial encounter: S12.9XXA

## 2015-09-01 LAB — CBC
HCT: 39.5 % (ref 36.0–46.0)
HEMOGLOBIN: 12.9 g/dL (ref 12.0–15.0)
MCH: 29.3 pg (ref 26.0–34.0)
MCHC: 32.7 g/dL (ref 30.0–36.0)
MCV: 89.6 fL (ref 78.0–100.0)
Platelets: 337 10*3/uL (ref 150–400)
RBC: 4.41 MIL/uL (ref 3.87–5.11)
RDW: 15.2 % (ref 11.5–15.5)
WBC: 8.9 10*3/uL (ref 4.0–10.5)

## 2015-09-01 LAB — BASIC METABOLIC PANEL
ANION GAP: 4 — AB (ref 5–15)
BUN: 14 mg/dL (ref 6–20)
CALCIUM: 9 mg/dL (ref 8.9–10.3)
CHLORIDE: 106 mmol/L (ref 101–111)
CO2: 28 mmol/L (ref 22–32)
Creatinine, Ser: 0.8 mg/dL (ref 0.44–1.00)
GFR calc Af Amer: >60 mL/min (ref >60)
GFR calc non Af Amer: >60 mL/min (ref >60)
GLUCOSE: 96 mg/dL (ref 65–99)
Potassium: 4.9 mmol/L (ref 3.5–5.1)
Sodium: 138 mmol/L (ref 135–145)

## 2015-09-01 LAB — SURGICAL PCR SCREEN
MRSA, PCR: NEGATIVE
Staphylococcus aureus: NEGATIVE

## 2015-09-02 NOTE — H&P (Signed)
TOTAL HIP ADMISSION H&P  Patient is admitted for left total hip arthroplasty, anterior approach.  Subjective:  Chief Complaint:     Left hip primary OA / pain  HPI: Janet Potts, 55 y.o. female, has a history of pain and functional disability in the left hip(s) due to arthritis and patient has failed non-surgical conservative treatments for greater than 12 weeks to include NSAID's and/or analgesics, corticosteriod injections, use of assistive devices and activity modification.  Onset of symptoms was gradual starting 2+ years ago with gradually worsening course since that time.The patient noted prior procedures of the hip to include arthroplasty on the right hip per Dr. Alvan Dame 2015.  Patient currently rates pain in the left hip at 10 out of 10 with activity. Patient has night pain, worsening of pain with activity and weight bearing, trendelenberg gait, pain that interfers with activities of daily living and pain with passive range of motion. Patient has evidence of periarticular osteophytes and joint space narrowing by imaging studies. This condition presents safety issues increasing the risk of falls.   There is no current active infection.   Risks, benefits and expectations were discussed with the patient.  Risks including but not limited to the risk of anesthesia, blood clots, nerve damage, blood vessel damage, failure of the prosthesis, infection and up to and including death.  Patient understand the risks, benefits and expectations and wishes to proceed with surgery.   PCP: Cher Nakai, MD  D/C Plans:      Home with HHPT  Post-op Meds:       No Rx given  Tranexamic Acid:      To be given - IV   Decadron:      Is to be given  FYI:     Xarelto (no ASA do to gastric surgery)  Oxycodone (on pre-op)  No Celebrex    Patient Active Problem List   Diagnosis Date Noted  . Expected blood loss anemia 07/16/2013  . Obese 07/16/2013  . 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 Herniation of cervical  intervertebral disc 05/31/2012   Past Medical History  Diagnosis Date  . 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 Gastric bypass status for obesity 06  . Anxiety     anxiety  . UTI (urinary tract infection)     ? irritated  . GERD (gastroesophageal reflux disease)     hx before gastric bypass  . Fibromyalgia     ?  Marland Kitchen Sinusitis   . PTSD (post-traumatic stress disorder)     due to numerous surgeries   . Chronic kidney disease     cyst on [place]   . Arthritis   . Anemia     hx of at age 1   . Pneumonia     20's  . Cancer (HCC)     hx of precancerous cells being removed   . Chronic pain   . Neck fracture ([place]) 2011  . Complication of anesthesia     limited neck motion, left worse than right   . PONV (postoperative nausea and vomiting)     occ  . Constipation     Past Surgical History  Procedure Laterality Date  . Gastic  06    bypass  . Abdominal hysterectomy  98,08    partial, oophorectomy bil  . Tonsillectomy  74  . Ankle arthroscopy Right 99    cartilage  . Sinus exploration  00  .  Gastricsurgery  10    repair intestinal rupture  . Cervical disc arthroplasty N/A 05/31/2012    Procedure: CERVICAL ANTERIOR DISC ARTHROPLASTY;  Surgeon: Charlie Pitter, MD;  Location: [place] [facility];  Service: Neurosurgery;  Laterality: N/A;  C-Arm  . Ankle surgery  2014    left ankle with titanium plate   . Gastric surgeyr       for intestinal blockage due to pouch rupturing   . Total hip arthroplasty Right 07/15/2013    Procedure: RIGHT TOTAL HIP ARTHROPLASTY ANTERIOR APPROACH;  Surgeon: Mauri Pole, MD;  Location: [facility] ORS;  Service: Orthopedics;  Laterality: Right;  . Appendectomy  07  . Cholecystectomy  07  . Hardware removal from left ankle  2015    No prescriptions prior to admission   Allergies  Allergen Reactions  . Cymbalta [Duloxetine Hcl] Swelling  . Gabapentin Swelling  . Other     Silk tape---"eats  skin"  . Silvadene [Silver Sulfadiazine] Swelling    hiaves and rash   . Sulfa Antibiotics Swelling  . Zanaflex [Tizanidine Hcl] Swelling  . Adhesive [Tape] Hives, Swelling and Rash  . Avelox [Moxifloxacin] Rash    Social History  Substance Use Topics  . Smoking status: Never Smoker   . Smokeless tobacco: Never Used  . Alcohol Use: 3.4 oz/week    4 Glasses of wine, 2 Standard drinks or equivalent per week     Comment: occ       Review of Systems  Constitutional: Negative.   Eyes: Negative.   Respiratory: Negative.   Cardiovascular: Negative.   Gastrointestinal: Positive for heartburn and constipation.  Genitourinary: Negative.   Musculoskeletal: Positive for back pain, joint pain and neck pain.  Skin: Negative.   Neurological: Negative.   Endo/Heme/Allergies: Negative.   Psychiatric/Behavioral: The patient is nervous/anxious.     Objective:  Physical Exam  Constitutional: She is oriented to person, place, and time. She appears well-developed.  HENT:  Head: Normocephalic.  Eyes: Pupils are equal, round, and reactive to light.  Neck: Neck supple. No JVD present. No tracheal deviation present. No thyromegaly present.  Cardiovascular: Normal rate, regular rhythm, normal heart sounds and intact distal pulses.   Respiratory: Effort normal and breath sounds normal. No stridor. No respiratory distress. She has no wheezes.  GI: Soft. There is no tenderness. There is no guarding.  Musculoskeletal:       Left hip: She exhibits decreased range of motion, decreased strength, tenderness and bony tenderness. She exhibits no swelling, no deformity and no laceration.  Lymphadenopathy:    She has no cervical adenopathy.  Neurological: She is alert and oriented to person, place, and time.  Skin: Skin is warm and dry.    Vital signs in last 24 hours: Temp:  [98.3 F (36.8 C)] 98.3 F (36.8 C) (05/31 1121) Pulse Rate:  [51] 51 (05/31 1121) Resp:  [18] 18 (05/31 1121) BP:  (162)/(76) 162/76 mmHg (05/31 1121) SpO2:  [100 %] 100 % (05/31 1121) Weight:  [90.901 kg (200 lb 6.4 oz)] 90.901 kg (200 lb 6.4 oz) (05/31 1121)  Labs:   Estimated body mass index is 36.21 kg/(m^2) as calculated from the following:   Height as of this encounter: 5\' 2"  (1.575 m).   Weight as of this encounter: 89.812 kg (198 lb).   Imaging Review Plain radiographs demonstrate severe degenerative joint disease of the left hip(s). The bone quality appears to be good for age and reported activity level.  Assessment/Plan:  End  stage arthritis, left hip(s)  The patient history, physical examination, clinical judgement of the provider and imaging studies are consistent with end stage degenerative joint disease of the left hip(s) and total hip arthroplasty is deemed medically necessary. The treatment options including medical management, injection therapy, arthroscopy and arthroplasty were discussed at length. The risks and benefits of total hip arthroplasty were presented and reviewed. The risks due to aseptic loosening, infection, stiffness, dislocation/subluxation,  thromboembolic complications and other imponderables were discussed.  The patient acknowledged the explanation, agreed to proceed with the plan and consent was signed. Patient is being admitted for inpatient treatment for surgery, pain control, PT, OT, prophylactic antibiotics, VTE prophylaxis, progressive ambulation and ADL's and discharge planning.The patient is planning to be discharged home with home health services.      [place] Pugh Ezana Hubbert   PA-C  09/02/2015, 9:10 AM

## 2015-09-14 ENCOUNTER — Inpatient Hospital Stay (HOSPITAL_COMMUNITY): Payer: 59 | Admitting: Anesthesiology

## 2015-09-14 ENCOUNTER — Encounter (HOSPITAL_COMMUNITY)
Admission: RE | Disposition: A | Discharge status: Home Health | Payer: Self-pay | Source: Ambulatory Visit | Attending: Orthopedic Surgery

## 2015-09-14 ENCOUNTER — Inpatient Hospital Stay (HOSPITAL_COMMUNITY): Payer: 59

## 2015-09-14 ENCOUNTER — Encounter (HOSPITAL_COMMUNITY): Payer: Self-pay | Admitting: *Deleted

## 2015-09-14 ENCOUNTER — Inpatient Hospital Stay (HOSPITAL_COMMUNITY)
Admission: RE | Admit: 2015-09-14 | Discharge: 2015-09-15 | DRG: 470 | Disposition: A | Discharge status: Home Health | Payer: 59 | Source: Ambulatory Visit | Attending: Orthopedic Surgery | Admitting: Orthopedic Surgery

## 2015-09-14 DIAGNOSIS — E669 Obesity, unspecified: Secondary | ICD-10-CM | POA: Diagnosis present

## 2015-09-14 DIAGNOSIS — M797 Fibromyalgia: Secondary | ICD-10-CM | POA: Diagnosis present

## 2015-09-14 DIAGNOSIS — M25552 Pain in left hip: Secondary | ICD-10-CM | POA: Diagnosis present

## 2015-09-14 DIAGNOSIS — M1612 Unilateral primary osteoarthritis, left hip: Secondary | ICD-10-CM | POA: Diagnosis present

## 2015-09-14 DIAGNOSIS — Z888 Allergy status to other drugs, medicaments and biological substances status: Secondary | ICD-10-CM

## 2015-09-14 DIAGNOSIS — Z9109 Other allergy status, other than to drugs and biological substances: Secondary | ICD-10-CM

## 2015-09-14 DIAGNOSIS — Z882 Allergy status to sulfonamides status: Secondary | ICD-10-CM | POA: Diagnosis not present

## 2015-09-14 DIAGNOSIS — Z96641 Presence of right artificial hip joint: Secondary | ICD-10-CM | POA: Diagnosis present

## 2015-09-14 DIAGNOSIS — K219 Gastro-esophageal reflux disease without esophagitis: Secondary | ICD-10-CM | POA: Diagnosis present

## 2015-09-14 DIAGNOSIS — Z9884 Bariatric surgery status: Secondary | ICD-10-CM | POA: Diagnosis not present

## 2015-09-14 DIAGNOSIS — F419 Anxiety disorder, unspecified: Secondary | ICD-10-CM | POA: Diagnosis present

## 2015-09-14 DIAGNOSIS — Z881 Allergy status to other antibiotic agents status: Secondary | ICD-10-CM

## 2015-09-14 DIAGNOSIS — F4310 Post-traumatic stress disorder, unspecified: Secondary | ICD-10-CM | POA: Diagnosis present

## 2015-09-14 DIAGNOSIS — Z96649 Presence of unspecified artificial hip joint: Secondary | ICD-10-CM

## 2015-09-14 DIAGNOSIS — Z6836 Body mass index (BMI) 36.0-36.9, adult: Secondary | ICD-10-CM

## 2015-09-14 HISTORY — PX: TOTAL HIP ARTHROPLASTY: SHX124

## 2015-09-14 LAB — TYPE AND SCREEN
ABO/RH(D): B POS
Antibody Screen: NEGATIVE

## 2015-09-14 SURGERY — ARTHROPLASTY, HIP, TOTAL, ANTERIOR APPROACH
Anesthesia: Spinal | Site: Hip | Laterality: Left

## 2015-09-14 MED ORDER — METOCLOPRAMIDE HCL 5 MG/ML IJ SOLN: 5.0000 mg | Freq: Three times a day (TID) | INTRAMUSCULAR | Status: DC | PRN

## 2015-09-14 MED ORDER — MEPERIDINE HCL 50 MG/ML IJ SOLN: 6.2500 mg | INTRAMUSCULAR | Status: DC | PRN

## 2015-09-14 MED ORDER — DEXAMETHASONE SODIUM PHOSPHATE 10 MG/ML IJ SOLN
INTRAMUSCULAR | Status: AC
  Filled 2015-09-14: qty 1

## 2015-09-14 MED ORDER — ONDANSETRON HCL 4 MG/2ML IJ SOLN: 4.0000 mg | Freq: Once | INTRAMUSCULAR | Status: DC | PRN

## 2015-09-14 MED ORDER — ALUM & MAG HYDROXIDE-SIMETH 200-200-20 MG/5ML PO SUSP: 30.0000 mL | ORAL | Status: DC | PRN

## 2015-09-14 MED ORDER — ONDANSETRON HCL 4 MG/2ML IJ SOLN
INTRAMUSCULAR | Status: DC | PRN
  Administered 2015-09-14: 4 mg via INTRAVENOUS

## 2015-09-14 MED ORDER — EST ESTROGENS-METHYLTEST 1.25-2.5 MG PO TABS: 1.0000 | ORAL_TABLET | Freq: Every evening | ORAL | Status: DC

## 2015-09-14 MED ORDER — ONDANSETRON HCL 4 MG/2ML IJ SOLN
4.0000 mg | Freq: Four times a day (QID) | INTRAMUSCULAR | Status: DC | PRN
  Administered 2015-09-15: 4 mg via INTRAVENOUS
  Filled 2015-09-14: qty 2

## 2015-09-14 MED ORDER — SODIUM CHLORIDE 0.9 % IJ SOLN
INTRAMUSCULAR | Status: AC
  Filled 2015-09-14: qty 10

## 2015-09-14 MED ORDER — LACTATED RINGERS IV SOLN
INTRAVENOUS | Status: DC
  Administered 2015-09-14: 1000 mL via INTRAVENOUS
  Administered 2015-09-14: 12:00:00 via INTRAVENOUS

## 2015-09-14 MED ORDER — DIPHENHYDRAMINE HCL 25 MG PO CAPS: 25.0000 mg | ORAL_CAPSULE | Freq: Four times a day (QID) | ORAL | Status: DC | PRN

## 2015-09-14 MED ORDER — LIDOCAINE HCL (CARDIAC) 20 MG/ML IV SOLN
INTRAVENOUS | Status: DC | PRN
  Administered 2015-09-14: 40 mg via INTRATRACHEAL
  Administered 2015-09-14: 50 mg via INTRATRACHEAL

## 2015-09-14 MED ORDER — ONDANSETRON HCL 4 MG PO TABS: 4.0000 mg | ORAL_TABLET | Freq: Four times a day (QID) | ORAL | Status: DC | PRN

## 2015-09-14 MED ORDER — CETYLPYRIDINIUM CHLORIDE 0.05 % MT LIQD
7.0000 mL | Freq: Two times a day (BID) | OROMUCOSAL | Status: DC
  Administered 2015-09-15: 7 mL via OROMUCOSAL

## 2015-09-14 MED ORDER — MENTHOL 3 MG MT LOZG: 1.0000 | LOZENGE | OROMUCOSAL | Status: DC | PRN

## 2015-09-14 MED ORDER — OXYCODONE HCL 5 MG PO TABS
10.0000 mg | ORAL_TABLET | ORAL | Status: DC
  Administered 2015-09-14 – 2015-09-15 (×5): 20 mg via ORAL
  Filled 2015-09-14 (×5): qty 4

## 2015-09-14 MED ORDER — MIDAZOLAM HCL 2 MG/2ML IJ SOLN
INTRAMUSCULAR | Status: AC
  Filled 2015-09-14: qty 2

## 2015-09-14 MED ORDER — EPHEDRINE SULFATE 50 MG/ML IJ SOLN
INTRAMUSCULAR | Status: AC
Start: 2015-09-14 — End: 2015-09-14
  Filled 2015-09-14: qty 1

## 2015-09-14 MED ORDER — FENTANYL CITRATE (PF) 100 MCG/2ML IJ SOLN
INTRAMUSCULAR | Status: AC
  Filled 2015-09-14: qty 2

## 2015-09-14 MED ORDER — HYDROMORPHONE HCL 1 MG/ML IJ SOLN
INTRAMUSCULAR | Status: AC
  Filled 2015-09-14: qty 1

## 2015-09-14 MED ORDER — RIVAROXABAN 10 MG PO TABS
10.0000 mg | ORAL_TABLET | ORAL | Status: DC
  Administered 2015-09-15: 10 mg via ORAL
  Filled 2015-09-14: qty 1

## 2015-09-14 MED ORDER — MIDAZOLAM HCL 5 MG/5ML IJ SOLN
INTRAMUSCULAR | Status: DC | PRN
  Administered 2015-09-14: 1 mg via INTRAVENOUS
  Administered 2015-09-14: 2 mg via INTRAVENOUS
  Administered 2015-09-14: 1 mg via INTRAVENOUS
  Administered 2015-09-14: 2 mg via INTRAVENOUS

## 2015-09-14 MED ORDER — HYDROMORPHONE HCL 1 MG/ML IJ SOLN
0.2500 mg | INTRAMUSCULAR | Status: DC | PRN
  Administered 2015-09-14 (×4): 0.5 mg via INTRAVENOUS

## 2015-09-14 MED ORDER — FERROUS SULFATE 325 (65 FE) MG PO TABS
325.0000 mg | ORAL_TABLET | Freq: Three times a day (TID) | ORAL | Status: DC
  Administered 2015-09-15 (×2): 325 mg via ORAL
  Filled 2015-09-14 (×2): qty 1

## 2015-09-14 MED ORDER — CEFAZOLIN SODIUM-DEXTROSE 2-4 GM/100ML-% IV SOLN
2.0000 g | INTRAVENOUS | Status: AC
  Administered 2015-09-14: 2 g via INTRAVENOUS

## 2015-09-14 MED ORDER — CEFAZOLIN SODIUM-DEXTROSE 2-4 GM/100ML-% IV SOLN
2.0000 g | Freq: Four times a day (QID) | INTRAVENOUS | Status: AC
  Administered 2015-09-14 (×2): 2 g via INTRAVENOUS
  Filled 2015-09-14 (×2): qty 100

## 2015-09-14 MED ORDER — TRANEXAMIC ACID 1000 MG/10ML IV SOLN
1000.0000 mg | Freq: Once | INTRAVENOUS | Status: AC
  Administered 2015-09-14: 1000 mg via INTRAVENOUS
  Filled 2015-09-14: qty 10

## 2015-09-14 MED ORDER — PHENYLEPHRINE HCL 10 MG/ML IJ SOLN
INTRAMUSCULAR | Status: DC | PRN
  Administered 2015-09-14: 40 ug via INTRAVENOUS
  Administered 2015-09-14: 80 ug via INTRAVENOUS

## 2015-09-14 MED ORDER — SODIUM CHLORIDE 0.9 % IV SOLN
INTRAVENOUS | Status: DC
  Administered 2015-09-14: 16:00:00 via INTRAVENOUS

## 2015-09-14 MED ORDER — PROPOFOL 10 MG/ML IV BOLUS
INTRAVENOUS | Status: DC | PRN
  Administered 2015-09-14: 20 mg via INTRAVENOUS

## 2015-09-14 MED ORDER — DEXAMETHASONE SODIUM PHOSPHATE 10 MG/ML IJ SOLN
10.0000 mg | Freq: Once | INTRAMUSCULAR | Status: AC
  Administered 2015-09-14: 10 mg via INTRAVENOUS

## 2015-09-14 MED ORDER — DEXAMETHASONE SODIUM PHOSPHATE 10 MG/ML IJ SOLN
10.0000 mg | Freq: Once | INTRAMUSCULAR | Status: AC
  Administered 2015-09-15: 10 mg via INTRAVENOUS
  Filled 2015-09-14: qty 1

## 2015-09-14 MED ORDER — MIDAZOLAM HCL 2 MG/2ML IJ SOLN
INTRAMUSCULAR | Status: AC
Start: 2015-09-14 — End: 2015-09-14
  Filled 2015-09-14: qty 2

## 2015-09-14 MED ORDER — ACETAMINOPHEN 500 MG PO TABS
1000.0000 mg | ORAL_TABLET | Freq: Three times a day (TID) | ORAL | Status: DC
  Administered 2015-09-14 – 2015-09-15 (×4): 1000 mg via ORAL
  Filled 2015-09-14 (×4): qty 2

## 2015-09-14 MED ORDER — PROPOFOL 500 MG/50ML IV EMUL
INTRAVENOUS | Status: DC | PRN
  Administered 2015-09-14: 20 ug via INTRAVENOUS

## 2015-09-14 MED ORDER — ALPRAZOLAM 0.5 MG PO TABS
0.5000 mg | ORAL_TABLET | Freq: Every day | ORAL | Status: DC | PRN
  Administered 2015-09-14: 0.5 mg via ORAL
  Filled 2015-09-14 (×2): qty 1

## 2015-09-14 MED ORDER — FENTANYL CITRATE (PF) 100 MCG/2ML IJ SOLN
INTRAMUSCULAR | Status: DC | PRN
  Administered 2015-09-14 (×2): 100 ug via INTRAVENOUS

## 2015-09-14 MED ORDER — PROPOFOL 10 MG/ML IV BOLUS
INTRAVENOUS | Status: AC
  Filled 2015-09-14: qty 60

## 2015-09-14 MED ORDER — METHOCARBAMOL 500 MG PO TABS
500.0000 mg | ORAL_TABLET | Freq: Four times a day (QID) | ORAL | Status: DC | PRN
  Administered 2015-09-14: 500 mg via ORAL
  Filled 2015-09-14: qty 1

## 2015-09-14 MED ORDER — HYDROMORPHONE HCL 1 MG/ML IJ SOLN
0.5000 mg | INTRAMUSCULAR | Status: DC | PRN
  Administered 2015-09-14 – 2015-09-15 (×7): 1 mg via INTRAVENOUS
  Filled 2015-09-14 (×7): qty 1

## 2015-09-14 MED ORDER — FENTANYL CITRATE (PF) 100 MCG/2ML IJ SOLN
INTRAMUSCULAR | Status: AC
Start: 2015-09-14 — End: 2015-09-14
  Filled 2015-09-14: qty 2

## 2015-09-14 MED ORDER — CEFAZOLIN SODIUM-DEXTROSE 2-4 GM/100ML-% IV SOLN
INTRAVENOUS | Status: AC
  Filled 2015-09-14: qty 100

## 2015-09-14 MED ORDER — SODIUM CHLORIDE 0.9 % IR SOLN
Status: DC | PRN
  Administered 2015-09-14: 1000 mL

## 2015-09-14 MED ORDER — PHENOL 1.4 % MT LIQD: 1.0000 | OROMUCOSAL | Status: DC | PRN

## 2015-09-14 MED ORDER — EPHEDRINE SULFATE 50 MG/ML IJ SOLN
INTRAMUSCULAR | Status: DC | PRN
  Administered 2015-09-14: 10 mg via INTRAVENOUS

## 2015-09-14 MED ORDER — BISACODYL 10 MG RE SUPP: 10.0000 mg | Freq: Every day | RECTAL | Status: DC | PRN

## 2015-09-14 MED ORDER — METHOCARBAMOL 500 MG PO TABS
750.0000 mg | ORAL_TABLET | Freq: Four times a day (QID) | ORAL | Status: DC | PRN
  Administered 2015-09-15 (×2): 750 mg via ORAL
  Filled 2015-09-14 (×2): qty 2

## 2015-09-14 MED ORDER — PANTOPRAZOLE SODIUM 40 MG PO TBEC
40.0000 mg | DELAYED_RELEASE_TABLET | Freq: Two times a day (BID) | ORAL | Status: DC | PRN
Start: 2015-09-14 — End: 2015-09-15
  Administered 2015-09-15: 40 mg via ORAL
  Filled 2015-09-14: qty 1

## 2015-09-14 MED ORDER — LIDOCAINE HCL (CARDIAC) 20 MG/ML IV SOLN
INTRAVENOUS | Status: AC
  Filled 2015-09-14: qty 5

## 2015-09-14 MED ORDER — LIDOCAINE HCL (CARDIAC) 20 MG/ML IV SOLN
INTRAVENOUS | Status: AC
Start: 2015-09-14 — End: 2015-09-14
  Filled 2015-09-14: qty 5

## 2015-09-14 MED ORDER — METOCLOPRAMIDE HCL 5 MG PO TABS
5.0000 mg | ORAL_TABLET | Freq: Three times a day (TID) | ORAL | Status: DC | PRN
Start: 2015-09-14 — End: 2015-09-15

## 2015-09-14 MED ORDER — DOCUSATE SODIUM 100 MG PO CAPS
100.0000 mg | ORAL_CAPSULE | Freq: Two times a day (BID) | ORAL | Status: DC
  Administered 2015-09-14 – 2015-09-15 (×2): 100 mg via ORAL
  Filled 2015-09-14 (×2): qty 1

## 2015-09-14 MED ORDER — PROPOFOL 500 MG/50ML IV EMUL
INTRAVENOUS | Status: DC | PRN
  Administered 2015-09-14: 75 ug/kg/min via INTRAVENOUS

## 2015-09-14 MED ORDER — METHOCARBAMOL 1000 MG/10ML IJ SOLN
500.0000 mg | Freq: Four times a day (QID) | INTRAVENOUS | Status: DC | PRN
  Administered 2015-09-14: 500 mg via INTRAVENOUS
  Filled 2015-09-14: qty 550
  Filled 2015-09-14: qty 5

## 2015-09-14 MED ORDER — AMITRIPTYLINE HCL 25 MG PO TABS
25.0000 mg | ORAL_TABLET | Freq: Every evening | ORAL | Status: DC | PRN
  Filled 2015-09-14: qty 1

## 2015-09-14 MED ORDER — ONDANSETRON HCL 4 MG/2ML IJ SOLN
INTRAMUSCULAR | Status: AC
  Filled 2015-09-14: qty 2

## 2015-09-14 MED ORDER — MAGNESIUM CITRATE PO SOLN: 1.0000 | Freq: Once | ORAL | Status: DC | PRN

## 2015-09-14 MED ORDER — BUPIVACAINE HCL (PF) 0.5 % IJ SOLN
INTRAMUSCULAR | Status: AC
  Filled 2015-09-14: qty 30

## 2015-09-14 MED ORDER — PROPOFOL 500 MG/50ML IV EMUL: INTRAVENOUS | Status: DC | PRN

## 2015-09-14 MED ORDER — KETOROLAC TROMETHAMINE 15 MG/ML IJ SOLN
15.0000 mg | Freq: Four times a day (QID) | INTRAMUSCULAR | Status: DC
  Filled 2015-09-14: qty 1

## 2015-09-14 MED ORDER — POLYETHYLENE GLYCOL 3350 17 G PO PACK
17.0000 g | PACK | Freq: Two times a day (BID) | ORAL | Status: DC
  Administered 2015-09-15: 17 g via ORAL
  Filled 2015-09-14: qty 1

## 2015-09-14 SURGICAL SUPPLY — 36 items
BAG DECANTER FOR FLEXI CONT (MISCELLANEOUS) IMPLANT
BAG SPEC THK2 15X12 ZIP CLS (MISCELLANEOUS)
BAG ZIPLOCK 12X15 (MISCELLANEOUS) IMPLANT
CAPT HIP TOTAL 2 ×2 IMPLANT
CLOTH BEACON ORANGE TIMEOUT ST (SAFETY) ×3 IMPLANT
COVER PERINEAL POST (MISCELLANEOUS) ×3 IMPLANT
DRAPE STERI IOBAN 125X83 (DRAPES) ×3 IMPLANT
DRAPE U-SHAPE 47X51 STRL (DRAPES) ×6 IMPLANT
DRESSING AQUACEL AG SP 3.5X10 (GAUZE/BANDAGES/DRESSINGS) ×1 IMPLANT
DRSG AQUACEL AG SP 3.5X10 (GAUZE/BANDAGES/DRESSINGS) ×3
DURAPREP 26ML APPLICATOR (WOUND CARE) ×3 IMPLANT
ELECT REM PT RETURN 15FT ADLT (MISCELLANEOUS) IMPLANT
ELECT REM PT RETURN 9FT ADLT (ELECTROSURGICAL) ×3
ELECTRODE REM PT RTRN 9FT ADLT (ELECTROSURGICAL) ×1 IMPLANT
GLOVE BIOGEL M STRL SZ7.5 (GLOVE) IMPLANT
GLOVE BIOGEL PI IND STRL 7.5 (GLOVE) ×1 IMPLANT
GLOVE BIOGEL PI IND STRL 8.5 (GLOVE) ×1 IMPLANT
GLOVE BIOGEL PI INDICATOR 7.5 (GLOVE) ×2
GLOVE BIOGEL PI INDICATOR 8.5 (GLOVE) ×2
GLOVE ECLIPSE 8.0 STRL XLNG CF (GLOVE) ×6 IMPLANT
GLOVE ORTHO TXT STRL SZ7.5 (GLOVE) ×3 IMPLANT
GOWN STRL REUS W/TWL LRG LVL3 (GOWN DISPOSABLE) ×5 IMPLANT
GOWN STRL REUS W/TWL XL LVL3 (GOWN DISPOSABLE) ×5 IMPLANT
HOLDER FOLEY CATH W/STRAP (MISCELLANEOUS) ×3 IMPLANT
LIQUID BAND (GAUZE/BANDAGES/DRESSINGS) ×3 IMPLANT
PACK ANTERIOR HIP CUSTOM (KITS) ×3 IMPLANT
SAW OSC TIP CART 19.5X105X1.3 (SAW) ×3 IMPLANT
SUT MNCRL AB 4-0 PS2 18 (SUTURE) ×3 IMPLANT
SUT VIC AB 1 CT1 36 (SUTURE) ×9 IMPLANT
SUT VIC AB 2-0 CT1 27 (SUTURE) ×6
SUT VIC AB 2-0 CT1 TAPERPNT 27 (SUTURE) ×2 IMPLANT
SUT VLOC 180 0 24IN GS25 (SUTURE) ×3 IMPLANT
TRAY FOLEY W/METER SILVER 14FR (SET/KITS/TRAYS/PACK) ×2 IMPLANT
TRAY FOLEY W/METER SILVER 16FR (SET/KITS/TRAYS/PACK) IMPLANT
WATER STERILE IRR 1500ML POUR (IV SOLUTION) ×3 IMPLANT
YANKAUER SUCT BULB TIP 10FT TU (MISCELLANEOUS) IMPLANT

## 2015-09-14 NOTE — Op Note (Signed)
NAME:  Janet Potts                ACCOUNT NO.: [id number]      MEDICAL RECORD NO.: [id number]      FACILITY:  [facility]      PHYSICIAN:  Paralee Cancel D  DATE OF BIRTH:  Dec 24, 1960     DATE OF PROCEDURE:  09/14/2015                                 OPERATIVE REPORT         PREOPERATIVE DIAGNOSIS: Left  hip degenerative joint disease.      POSTOPERATIVE DIAGNOSIS:  Left hip degenerative joint disease.      PROCEDURE:  Left total hip replacement through an anterior approach   utilizing DePuy THR system, component size 53mm pinnacle cup, a size 32+4 neutral   Altrex liner, a size 2 Hi Tri Lock stem with a 32+8.5 delta ceramic   ball.      SURGEON:  Pietro Cassis. Alvan Dame, M.D.      ASSISTANT:  Nehemiah Massed, PA-C      ANESTHESIA:  Spinal.      SPECIMENS:  None.      COMPLICATIONS:  None.      BLOOD LOSS:  250 cc     DRAINS:  none.      INDICATION OF THE PROCEDURE:  Janet Potts is a 55 y.o. female who had   presented to office for evaluation of left hip pain.  Radiographs revealed   progressive degenerative changes with bone-on-bone   articulation to the  hip joint.  The patient had painful limited range of   motion significantly affecting their overall quality of life.  The patient was failing to    respond to conservative measures, and at this point was ready   to proceed with more definitive measures.  The patient has noted progressive   degenerative changes in his hip, progressive problems and dysfunction   with regarding the hip prior to surgery.  Consent was obtained for   benefit of pain relief.  Specific risk of infection, DVT, component   failure, dislocation, need for revision surgery, as well discussion of   the anterior versus posterior approach were reviewed.  Consent was   obtained for benefit of anterior pain relief through an anterior   approach.      PROCEDURE IN DETAIL:  The patient was brought to operative theater.   Once adequate  anesthesia, preoperative antibiotics, 2gm of Ancef, 1 gm of Tranexamic Acid, and 10 mg of Decadron administered.   The patient was positioned supine on the OSI Hanna table.  Once adequate   padding of boney process was carried out, we had predraped out the hip, and  used fluoroscopy to confirm orientation of the pelvis and position.      The left hip was then prepped and draped from proximal iliac crest to   mid thigh with shower curtain technique.      Time-out was performed identifying the patient, planned procedure, and   extremity.     An incision was then made 2 cm distal and lateral to the   anterior superior iliac spine extending over the orientation of the   tensor fascia lata muscle and sharp dissection was carried down to the   fascia of the muscle and protractor placed in the soft tissues.  The fascia was then incised.  The muscle belly was identified and swept   laterally and retractor placed along the superior neck.  Following   cauterization of the circumflex vessels and removing some pericapsular   fat, a second cobra retractor was placed on the inferior neck.  A third   retractor was placed on the anterior acetabulum after elevating the   anterior rectus.  A L-capsulotomy was along the line of the   superior neck to the trochanteric fossa, then extended proximally and   distally.  Tag sutures were placed and the retractors were then placed   intracapsular.  We then identified the trochanteric fossa and   orientation of my neck cut, confirmed this radiographically   and then made a neck osteotomy with the femur on traction.  The femoral   head was removed without difficulty or complication.  Traction was let   off and retractors were placed posterior and anterior around the   acetabulum.      The labrum and foveal tissue were debrided.  I began reaming with a 73mm   reamer and reamed up to 54mm reamer with good bony bed preparation and a 29mm   cup was chosen.  The  final 18mm Pinnacle cup was then impacted under fluoroscopy  to confirm the depth of penetration and orientation with respect to   abduction.  A screw was placed followed by the hole eliminator.  The final   32+4 neutral Altrex liner was impacted with good visualized rim fit.  The cup was positioned anatomically within the acetabular portion of the pelvis.      At this point, the femur was rolled at 80 degrees.  Further capsule was   released off the inferior aspect of the femoral neck.  I then   released the superior capsule proximally.  The hook was placed laterally   along the femur and elevated manually and held in position with the bed   hook.  The leg was then extended and adducted with the leg rolled to 100   degrees of external rotation.  Once the proximal femur was fully   exposed, I used a box osteotome to set orientation.  I then began   broaching with the starting chili pepper broach and passed this by hand and then broached up to 2.  With the 2 broach in place I chose a high offset neck and did several trial reductions.  The offset was appropriate, leg lengths   appeared to be equal best matched with the +9 head ball (both offset and length versus up sizing to the 3 component) confirmed radiographically.   Given these findings, I went ahead and dislocated the hip, repositioned all   retractors and positioned the right hip in the extended and abducted position.  The final 2 Hi Tri Lock stem was   chosen and it was impacted down to the level of neck cut.  Based on this   and the trial reduction, a 32+9 delta ceramic ball was chosen and   impacted onto a clean and dry trunnion, and the hip was reduced.  The   hip had been irrigated throughout the case again at this point.  I did   reapproximate the superior capsular leaflet to the anterior leaflet   using #1 Vicryl.  The fascia of the   tensor fascia lata muscle was then reapproximated using #1 Vicryl and #0 V-lock sutures.  The    remaining wound was closed with  2-0 Vicryl and running 4-0 Monocryl.   The hip was cleaned, dried, and dressed sterilely using Dermabond and   Aquacel dressing.  She was then brought   to recovery room in stable condition tolerating the procedure well.    Nehemiah Massed, PA-C was present for the entirety of the case involved from   preoperative positioning, perioperative retractor management, general   facilitation of the case, as well as primary wound closure as assistant.            Pietro Cassis Alvan Dame, M.D.        09/14/2015 12:52 PM

## 2015-09-14 NOTE — Anesthesia Postprocedure Evaluation (Signed)
Anesthesia Post Note  Patient: Janet Potts  Procedure(s) Performed: Procedure(s) (LRB): LEFT TOTAL HIP ARTHROPLASTY ANTERIOR APPROACH (Left)  Patient location during evaluation: PACU Anesthesia Type: Spinal Level of consciousness: oriented and awake and alert Pain management: pain level controlled Vital Signs Assessment: post-procedure vital signs reviewed and stable Respiratory status: spontaneous breathing, respiratory function stable and patient connected to nasal cannula oxygen Cardiovascular status: blood pressure returned to baseline and stable Postop Assessment: no headache and no backache Anesthetic complications: no    Last Vitals:  Filed Vitals:   09/14/15 1400 09/14/15 1415  BP: 130/74 119/70  Pulse: 51 52  Temp: 36.8 C 36.5 C  Resp: 12 16    Last Pain:  Filed Vitals:   09/14/15 1425  PainSc: 5                  [name] DAVID

## 2015-09-14 NOTE — Anesthesia Preprocedure Evaluation (Signed)
Anesthesia Evaluation  Patient identified by MRN, date of birth, ID band Patient awake    History of Anesthesia Complications (+) PONV  Airway Mallampati: I  TM Distance: >3 FB Neck ROM: Full    Dental   Pulmonary    Pulmonary exam normal        Cardiovascular Normal cardiovascular exam     Neuro/Psych Anxiety    GI/Hepatic   Endo/Other    Renal/GU      Musculoskeletal   Abdominal   Peds  Hematology   Anesthesia Other Findings   Reproductive/Obstetrics                             Anesthesia Physical Anesthesia Plan  ASA: II  Anesthesia Plan: Spinal   Post-op Pain Management:    Induction: Intravenous  Airway Management Planned: Simple Face Mask  Additional Equipment:   Intra-op Plan:   Post-operative Plan:   Informed Consent: I have reviewed the patients History and Physical, chart, labs and discussed the procedure including the risks, benefits and alternatives for the proposed anesthesia with the patient or authorized representative who has indicated his/her understanding and acceptance.     Plan Discussed with: CRNA and Surgeon  Anesthesia Plan Comments:         Anesthesia Quick Evaluation

## 2015-09-14 NOTE — Transfer of Care (Signed)
Immediate Anesthesia Transfer of Care Note  Patient: Morireoluwa Hollie  Procedure(s) Performed: Procedure(s): LEFT TOTAL HIP ARTHROPLASTY ANTERIOR APPROACH (Left)  Patient Location: PACU  Anesthesia Type:MAC and Spinal  Level of Consciousness: awake, alert , oriented and patient cooperative  Airway & Oxygen Therapy: Patient Spontanous Breathing and Patient connected to face mask oxygen  Post-op Assessment: Report given to RN and Post -op Vital signs reviewed and stable  Post vital signs: Reviewed and stable  Last Vitals:  Filed Vitals:   09/14/15 0852  BP: 169/72  Pulse: 57  Temp: 36.6 C  Resp: 18    Last Pain:  Filed Vitals:   09/14/15 1019  PainSc: 5       Patients Stated Pain Goal: 4 ([id number])  Complications: No apparent anesthesia complications

## 2015-09-14 NOTE — Progress Notes (Signed)
09/14/15 2130 Nursing Mechele Claude PA called reg patients c/o of pain despite prescribed regimen. Order received to increase robaxin to 750 mg. Order obtained for tordol iv . 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 Will continue to monitor patient.

## 2015-09-14 NOTE — Interval H&P Note (Signed)
History and Physical Interval Note:  09/14/2015 10:12 AM  Janet Potts  has presented today for surgery, with the diagnosis of LEFT HIP OA  The various methods of treatment have been discussed with the patient and family. After consideration of risks, benefits and other options for treatment, the patient has consented to  Procedure(s): LEFT TOTAL HIP ARTHROPLASTY ANTERIOR APPROACH (Left) as a surgical intervention .  The patient's history has been reviewed, patient examined, no change in status, stable for surgery.  I have reviewed the patient's chart and labs.  Questions were answered to the patient's satisfaction.     Mauri Pole

## 2015-09-14 NOTE — Discharge Instructions (Addendum)
INSTRUCTIONS AFTER JOINT REPLACEMENT  ° °o Remove items at home which could result in a fall. This includes throw rugs or furniture in walking pathways °o ICE to the affected joint every three hours while awake for 30 minutes at a time, for at least the first 3-5 days, and then as needed for pain and swelling.  Continue to use ice for pain and swelling. You may notice swelling that will progress down to the foot and ankle.  This is normal after surgery.  Elevate your leg when you are not up walking on it.   °o Continue to use the breathing machine you got in the hospital (incentive spirometer) which will help keep your temperature down.  It is common for your temperature to cycle up and down following surgery, especially at night when you are not up moving around and exerting yourself.  The breathing machine keeps your lungs expanded and your temperature down. ° ° °DIET:  As you were doing prior to hospitalization, we recommend a well-balanced diet. ° °DRESSING / WOUND CARE / SHOWERING ° °Keep the surgical dressing until follow up.  The dressing is water proof, so you can shower without any extra covering.  IF THE DRESSING FALLS OFF or the wound gets wet inside, change the dressing with sterile gauze.  Please use good hand washing techniques before changing the dressing.  Do not use any lotions or creams on the incision until instructed by your surgeon.   ° °ACTIVITY ° °o Increase activity slowly as tolerated, but follow the weight bearing instructions below.   °o No driving for 6 weeks or until further direction given by your physician.  You cannot drive while taking narcotics.  °o No lifting or carrying greater than 10 lbs. until further directed by your surgeon. °o Avoid periods of inactivity such as sitting longer than an hour when not asleep. This helps prevent blood clots.  °o You may return to work once you are authorized by your doctor.  ° ° ° °WEIGHT BEARING  ° °Weight bearing as tolerated with assist  device (walker, cane, etc) as directed, use it as long as suggested by your surgeon or therapist, typically at least 4-6 weeks. ° ° °EXERCISES ° °Results after joint replacement surgery are often greatly improved when you follow the exercise, range of motion and muscle strengthening exercises prescribed by your doctor. Safety measures are also important to protect the joint from further injury. Any time any of these exercises cause you to have increased pain or swelling, decrease what you are doing until you are comfortable again and then slowly increase them. If you have problems or questions, call your caregiver or physical therapist for advice.  ° °Rehabilitation is important following a joint replacement. After just a few days of immobilization, the muscles of the leg can become weakened and shrink (atrophy).  These exercises are designed to build up the tone and strength of the thigh and leg muscles and to improve motion. Often times heat used for twenty to thirty minutes before working out will loosen up your tissues and help with improving the range of motion but do not use heat for the first two weeks following surgery (sometimes heat can increase post-operative swelling).  ° °These exercises can be done on a training (exercise) mat, on the floor, on a table or on a bed. Use whatever works the best and is most comfortable for you.    Use music or television while you are exercising so that   the exercises are a pleasant break in your day. This will make your life better with the exercises acting as a break in your routine that you can look forward to.   Perform all exercises about fifteen times, three times per day or as directed.  You should exercise both the operative leg and the other leg as well. ° °Exercises include: °  °• Quad Sets - Tighten up the muscle on the front of the thigh (Quad) and hold for 5-10 seconds.   °• Straight Leg Raises - With your knee straight (if you were given a brace, keep it on),  lift the leg to 60 degrees, hold for 3 seconds, and slowly lower the leg.  Perform this exercise against resistance later as your leg gets stronger.  °• Leg Slides: Lying on your back, slowly slide your foot toward your buttocks, bending your knee up off the floor (only go as far as is comfortable). Then slowly slide your foot back down until your leg is flat on the floor again.  °• Angel Wings: Lying on your back spread your legs to the side as far apart as you can without causing discomfort.  °• Hamstring Strength:  Lying on your back, push your heel against the floor with your leg straight by tightening up the muscles of your buttocks.  Repeat, but this time bend your knee to a comfortable angle, and push your heel against the floor.  You may put a pillow under the heel to make it more comfortable if necessary.  ° °A rehabilitation program following joint replacement surgery can speed recovery and prevent re-injury in the future due to weakened muscles. Contact your doctor or a physical therapist for more information on knee rehabilitation.  ° ° °CONSTIPATION ° °Constipation is defined medically as fewer than three stools per week and severe constipation as less than one stool per week.  Even if you have a regular bowel pattern at home, your normal regimen is likely to be disrupted due to multiple reasons following surgery.  Combination of anesthesia, postoperative narcotics, change in appetite and fluid intake all can affect your bowels.  ° °YOU MUST use at least one of the following options; they are listed in order of increasing strength to get the job done.  They are all available over the counter, and you may need to use some, POSSIBLY even all of these options:   ° °Drink plenty of fluids (prune juice may be helpful) and high fiber foods °Colace 100 mg by mouth twice a day  °Senokot for constipation as directed and as needed Dulcolax (bisacodyl), take with full glass of water  °Miralax (polyethylene glycol)  once or twice a day as needed. ° °If you have tried all these things and are unable to have a bowel movement in the first 3-4 days after surgery call either your surgeon or your primary doctor.   ° °If you experience loose stools or diarrhea, hold the medications until you stool forms back up.  If your symptoms do not get better within 1 week or if they get worse, check with your doctor.  If you experience "the worst abdominal pain ever" or develop nausea or vomiting, please contact the office immediately for further recommendations for treatment. ° ° °ITCHING:  If you experience itching with your medications, try taking only a single pain pill, or even half a pain pill at a time.  You can also use Benadryl over the counter for itching or also to   help with sleep.   TED HOSE STOCKINGS:  Use stockings on both legs until for at least 2 weeks or as directed by physician office. They may be removed at night for sleeping.  MEDICATIONS:  See your medication summary on the After Visit Summary that nursing will review with you.  You may have some home medications which will be placed on hold until you complete the course of blood thinner medication.  It is important for you to complete the blood thinner medication as prescribed.  PRECAUTIONS:  If you experience chest pain or shortness of breath - call 911 immediately for transfer to the hospital emergency department.   If you develop a fever greater that 101 F, purulent drainage from wound, increased redness or drainage from wound, foul odor from the wound/dressing, or calf pain - CONTACT YOUR SURGEON.                                                   FOLLOW-UP APPOINTMENTS:  If you do not already have a post-op appointment, please call the office for an appointment to be seen by your surgeon.  Guidelines for how soon to be seen are listed in your After Visit Summary, but are typically between 1-4 weeks after surgery.  OTHER INSTRUCTIONS:   Knee  Replacement:  Do not place pillow under knee, focus on keeping the knee straight while resting.  MAKE SURE YOU:   Understand these instructions.   Get help right away if you are not doing well or get worse.    Thank you for letting us be a part of your medical care team.  It is a privilege we respect greatly.  We hope these instructions will help you stay on track for a fast and full recovery!     Information on my medicine - XARELTO (Rivaroxaban)  This medication education was reviewed with me or my healthcare representative as part of my discharge preparation.  The pharmacist that spoke with me during my hospital stay was:  Emiliano Dyer, RPH  Why was Xarelto prescribed for you? Xarelto was prescribed for you to reduce the risk of blood clots forming after orthopedic surgery. The medical term for these abnormal blood clots is venous thromboembolism (VTE).  What do you need to know about xarelto ? Take your Xarelto ONCE DAILY at the same time every day. You may take it either with or without food.  If you have difficulty swallowing the tablet whole, you may crush it and mix in applesauce just prior to taking your dose.  Take Xarelto exactly as prescribed by your doctor and DO NOT stop taking Xarelto without talking to the doctor who prescribed the medication.  Stopping without other VTE prevention medication to take the place of Xarelto may increase your risk of developing a clot.  After discharge, you should have regular check-up appointments with your healthcare provider that is prescribing your Xarelto.    What do you do if you miss a dose? If you miss a dose, take it as soon as you remember on the same day then continue your regularly scheduled once daily regimen the next day. Do not take two doses of Xarelto on the same day.   Important Safety Information A possible side effect of Xarelto is bleeding. You should call your healthcare provider right away if you  experience any of the following: ? Bleeding from an injury or your nose that does not stop. ? Unusual colored urine (red or dark brown) or unusual colored stools (red or black). ? Unusual bruising for unknown reasons. ? A serious fall or if you hit your head (even if there is no bleeding).  Some medicines may interact with Xarelto and might increase your risk of bleeding while on Xarelto. To help avoid this, consult your healthcare provider or pharmacist prior to using any new prescription or non-prescription medications, including herbals, vitamins, non-steroidal anti-inflammatory drugs (NSAIDs) and supplements.  This website has more information on Xarelto: [email or web address]

## 2015-09-15 LAB — CBC
HCT: 34.3 % — ABNORMAL LOW (ref 36.0–46.0)
HEMOGLOBIN: 11.5 g/dL — AB (ref 12.0–15.0)
MCH: 29.4 pg (ref 26.0–34.0)
MCHC: 33.5 g/dL (ref 30.0–36.0)
MCV: 87.7 fL (ref 78.0–100.0)
Platelets: 256 10*3/uL (ref 150–400)
RBC: 3.91 MIL/uL (ref 3.87–5.11)
RDW: 13.3 % (ref 11.5–15.5)
WBC: 13.9 10*3/uL — ABNORMAL HIGH (ref 4.0–10.5)

## 2015-09-15 LAB — BASIC METABOLIC PANEL
ANION GAP: 3 — AB (ref 5–15)
BUN: 8 mg/dL (ref 6–20)
CHLORIDE: 107 mmol/L (ref 101–111)
CO2: 28 mmol/L (ref 22–32)
Calcium: 8.2 mg/dL — ABNORMAL LOW (ref 8.9–10.3)
Creatinine, Ser: 0.63 mg/dL (ref 0.44–1.00)
GFR calc Af Amer: >60 mL/min (ref >60)
GFR calc non Af Amer: >60 mL/min (ref >60)
GLUCOSE: 140 mg/dL — AB (ref 65–99)
POTASSIUM: 4.2 mmol/L (ref 3.5–5.1)
SODIUM: 138 mmol/L (ref 135–145)

## 2015-09-15 MED ORDER — OXYCODONE HCL 10 MG PO TABS: 10.0000 mg | ORAL_TABLET | ORAL | Status: AC | PRN

## 2015-09-15 MED ORDER — OXYCODONE HCL 5 MG PO TABS
10.0000 mg | ORAL_TABLET | ORAL | Status: DC
  Administered 2015-09-15 (×2): 20 mg via ORAL
  Filled 2015-09-15 (×3): qty 4

## 2015-09-15 MED ORDER — POLYETHYLENE GLYCOL 3350 17 G PO PACK: 17.0000 g | PACK | Freq: Two times a day (BID) | ORAL | Status: AC

## 2015-09-15 MED ORDER — DOCUSATE SODIUM 100 MG PO CAPS: 100.0000 mg | ORAL_CAPSULE | Freq: Two times a day (BID) | ORAL | Status: AC

## 2015-09-15 MED ORDER — RIVAROXABAN 10 MG PO TABS: 10.0000 mg | ORAL_TABLET | ORAL | Status: AC

## 2015-09-15 MED ORDER — METHOCARBAMOL 750 MG PO TABS
750.0000 mg | ORAL_TABLET | Freq: Four times a day (QID) | ORAL | Status: AC | PRN
Start: 2015-09-15 — End: ?

## 2015-09-15 MED ORDER — ACETAMINOPHEN 500 MG PO TABS: 1000.0000 mg | ORAL_TABLET | Freq: Three times a day (TID) | ORAL | Status: AC

## 2015-09-15 MED ORDER — ALPRAZOLAM 0.5 MG PO TABS
0.5000 mg | ORAL_TABLET | Freq: Three times a day (TID) | ORAL | Status: DC | PRN
  Administered 2015-09-15: 0.5 mg via ORAL

## 2015-09-15 NOTE — Progress Notes (Signed)
     Subjective: 1 Day Post-Op Procedure(s) (LRB): LEFT TOTAL HIP ARTHROPLASTY ANTERIOR APPROACH (Left)   Patient reports pain as mild, pain controlled. States that she didn't have a good night.  Very anxious and states that she is in pain with both her hip and neck (neck has been long time issues with previous trauma.)  We have discussed options and changed up some of her medications.  She and her husband state that she is anxious and this is not helping her in her pain.  If she does well with PT she may discharge home.   Objective:   VITALS:   Filed Vitals:   09/15/15 0205 09/15/15 0649  BP: 106/57 99/50  Pulse: 60 61  Temp: 98.1 F (36.7 C) 97.3 F (36.3 C)  Resp: 19 19    Dorsiflexion/Plantar flexion intact Incision: dressing C/D/I No cellulitis present Compartment soft  LABS  Recent Labs  09/15/15 0353  HGB 11.5*  HCT 34.3*  WBC 13.9*  PLT 256     Recent Labs  09/15/15 0353  NA 138  K 4.2  BUN 8  CREATININE 0.63  GLUCOSE 140*     Assessment/Plan: 1 Day Post-Op Procedure(s) (LRB): LEFT TOTAL HIP ARTHROPLASTY ANTERIOR APPROACH (Left) Foley cath to be d/c'ed today Advance diet Up with therapy D/C IV fluids Discharge home with home health Follow up in 2 weeks at [facility] Follow up with OLIN,Feliza Diven D in 2 weeks.  Contact information:  [facility] [address] [place] [id number]    Obese (BMI 30-39.9) Estimated body mass index is 36.57 kg/(m^2) as calculated from the following:   Height as of this encounter: 5\' 2"  (1.575 m).   Weight as of this encounter: 90.719 kg (200 lb). Patient also counseled that weight may inhibit the healing process Patient counseled that losing weight will help with future health issues      [place] Pugh. Diyari Cherne   PAC  09/15/2015, 9:28 AM

## 2015-09-15 NOTE — Progress Notes (Signed)
09/14/15 0020 Nursing PA Mechele Claude called reg patient's c/o of severe pain despite prescribed regimen. No further orders received. Patient reminded of pain regimen. Will continue to monitor patient

## 2015-09-15 NOTE — Progress Notes (Signed)
Physical Therapy Treatment Note    09/15/15 1500  PT Visit Information  Last PT Received On 09/15/15  Assistance Needed +1  History of Present Illness Pt is a 55 year old female s/p L direct anterior THA with hx of anxiety and PTSD due to numerous surgeries   Subjective Data  Subjective Pt reports pain better this afternoon and able to ambulate in hallway and practice one step into home.  Pt states she will perform some exercises once home, states she recalls from previous surgeries.  Pt had no further questions and feels ready to d/c home today.  Precautions  Precautions None  Restrictions  Other Position/Activity Restrictions WBAT  Pain Assessment  Pain Assessment Faces  Faces Pain Scale 4  Pain Location L hip  Pain Descriptors / Indicators Aching;Sore  Pain Intervention(s) Monitored during session;Premedicated before session;Limited activity within patient's tolerance;Repositioned;Ice applied  Cognition  Arousal/Alertness Awake/alert  Behavior During Therapy WFL for tasks assessed/performed  Overall Cognitive Status Within Functional Limits for tasks assessed  Transfers  Overall transfer level Needs assistance  Equipment used Rolling walker (2 wheeled)  Transfers Sit to/from Stand  Sit to Stand Min guard  General transfer comment verbal cues for UE and LE positioning  Ambulation/Gait  Ambulation/Gait assistance Min guard  Ambulation Distance (Feet) 100 Feet  Assistive device Rolling walker (2 wheeled)  Gait Pattern/deviations Step-to pattern;Antalgic;Decreased stance time - left  General Gait Details verbal cues for RW positoning, heel strike, posture, recliner following for safety  PT - End of Session  Equipment Utilized During Treatment Gait belt  Activity Tolerance Patient tolerated treatment well  Patient left in chair;with call bell/phone within reach;with family/visitor present  PT - Assessment/Plan  PT Plan Current plan remains appropriate  PT Frequency (ACUTE  ONLY) 7X/week  Follow Up Recommendations Home health PT  PT equipment None recommended by PT  PT Goal Progression  Progress towards PT goals Progressing toward goals  PT Time Calculation  PT Start Time (ACUTE ONLY) 1339  PT Stop Time (ACUTE ONLY) 1355  PT Time Calculation (min) (ACUTE ONLY) 16 min  PT General Charges  $$ ACUTE PT VISIT 1 Procedure  PT Treatments  $Gait Training 8-22 mins   Carmelia Bake, PT, DPT 09/15/2015 Pager: [phone]

## 2015-09-15 NOTE — Care Management Note (Signed)
Case Management Note  Patient Details  Name: Janet Potts MRN: [id number] Date of Birth: 1960-11-13  Subjective/Objective:                  LEFT TOTAL HIP ARTHROPLASTY ANTERIOR APPROACH (Left) Action/Plan: DISCHARGE PLANNING Expected Discharge Date:  09/15/15               Expected Discharge Plan:  [place]  In-House Referral:     Discharge planning Services  CM Consult  Post Acute Care Choice:    Choice offered to:  Patient  DME Arranged:  N/A DME Agency:  NA  HH Arranged:  PT HH Agency:  [facility]  Status of Service:  Completed, signed off  Medicare Important Message Given:    Date Medicare IM Given:    Medicare IM give by:    Date Additional Medicare IM Given:    Additional Medicare Important Message give by:     If discussed at [place] of Stay Meetings, dates discussed:    Additional Comments: Cm met with pt in room to offer choice.  Pt chooses [facility] to render HHPT.  Pt states she has rolling walker with "skis" and does NOT want a rolling walker with front wheels.  [facility] rep, at room and has spoken with pt concerning [facility] HHPT.  No other Cm needs were communicated. Dellie Catholic, RN 09/15/2015, 2:59 PM

## 2015-09-15 NOTE — Evaluation (Signed)
Physical Therapy Evaluation Patient Details Name: Janet Potts MRN: [id number] DOB: 19-Nov-1960 Today's Date: 09/15/2015   History of Present Illness  Pt is a 55 year old female s/p L direct anterior THA with hx of anxiety and PTSD due to numerous surgeries   Clinical Impression  Pt admitted with above diagnosis. Pt currently with functional limitations due to the deficits listed below (see PT Problem List).  Pt will benefit from skilled PT to increase their independence and safety with mobility to allow discharge to the venue listed below.   Pt reports mobility limited due to pain however agreeable to ambulate around room over to recliner. Pt will need to practice one step prior to d/c which may be later today.     Follow Up Recommendations Home health PT    Equipment Recommendations  None recommended by PT    Recommendations for Other Services       Precautions / Restrictions Precautions Precautions: None Restrictions Other Position/Activity Restrictions: WBAT      Mobility  Bed Mobility               General bed mobility comments: pt up on BSC upon entering  Transfers Overall transfer level: Needs assistance Equipment used: Rolling walker (2 wheeled) Transfers: Sit to/from Stand Sit to Stand: Min guard         General transfer comment: verbal cues for UE and LE positioning  Ambulation/Gait Ambulation/Gait assistance: Min guard Ambulation Distance (Feet): 10 Feet Assistive device: Rolling walker (2 wheeled) Gait Pattern/deviations: Step-to pattern;Antalgic;Decreased stance time - left;Decreased weight shift to left     General Gait Details: verbal cues for sequence, technique, RW positioning, pt encouraged to ambulate around bed over to recliner (did not feel able to continue into hallway)  Stairs            Wheelchair Mobility    Modified Rankin (Stroke Patients Only)       Balance                                              Pertinent Vitals/Pain Pain Assessment: Faces Faces Pain Scale: Hurts whole lot Pain Location: L hip Pain Descriptors / Indicators: Sore;Aching Pain Intervention(s): Limited activity within patient's tolerance;Monitored during session;Repositioned;Ice applied;Patient requesting pain meds-RN notified    Home Living Family/patient expects to be discharged to:: Private residence Living Arrangements: Spouse/significant other   Type of Home: House Home Access: Stairs to enter Entrance Stairs-Rails: None Entrance Stairs-Number of Steps: 1 Home Layout: Two level Home Equipment: Environmental consultant - standard      Prior Function Level of Independence: Independent with assistive device(s)               Hand Dominance        Extremity/Trunk Assessment               Lower Extremity Assessment: LLE deficits/detail   LLE Deficits / Details: functional L hip weakness observed, pt reports increased pain with mobility     Communication   Communication: No difficulties  Cognition Arousal/Alertness: Awake/alert Behavior During Therapy: Anxious Overall Cognitive Status: Within Functional Limits for tasks assessed                      General Comments      Exercises        Assessment/Plan    PT Assessment  Patient needs continued PT services  PT Diagnosis Difficulty walking;Acute pain   PT Problem List Decreased strength;Decreased mobility;Pain  PT Treatment Interventions Functional mobility training;Stair training;Gait training;DME instruction;Patient/family education;Therapeutic activities;Therapeutic exercise   PT Goals (Current goals can be found in the Care Plan section) Acute Rehab PT Goals PT Goal Formulation: With patient Time For Goal Achievement: 09/18/15 Potential to Achieve Goals: Good    Frequency 7X/week   Barriers to discharge        Co-evaluation               End of Session Equipment Utilized During Treatment: Gait belt Activity  Tolerance: Patient limited by pain Patient left: in chair;with call bell/phone within reach;with chair alarm set;with family/visitor present Nurse Communication: Mobility status;Patient requests pain meds         Time: 1026-1039 PT Time Calculation (min) (ACUTE ONLY): 13 min   Charges:   PT Evaluation $PT Eval Low Complexity: 1 Procedure     PT G Codes:        Samiksha Pellicano,KATHrine E 09/15/2015, 11:47 AM Carmelia Bake, PT, DPT 09/15/2015 Pager: [phone]

## 2015-09-15 NOTE — Evaluation (Signed)
Occupational Therapy Evaluation Patient Details Name: Janet Potts MRN: [id number] DOB: 1960-11-18 Today's Date: 09/15/2015    History of Present Illness Pt is a 55 year old female s/p L direct anterior THA with hx of anxiety and PTSD due to numerous surgeries    Clinical Impression   Patient evaluated by Occupational Therapy with no further acute OT needs identified. All education has been completed and the patient has no further questions. Pt is able to perform ADLs with supervision - min A.  She has all DME and spouse able to assist until she is able to access Lt foot.  See below for any follow-up Occupational Therapy or equipment needs. OT is signing off. Thank you for this referral.      Follow Up Recommendations  No OT follow up;Supervision/Assistance - 24 hour    Equipment Recommendations  None recommended by OT    Recommendations for Other Services       Precautions / Restrictions Precautions Precautions: None Restrictions Other Position/Activity Restrictions: WBAT      Mobility Bed Mobility                  Transfers Overall transfer level: Needs assistance Equipment used: Rolling walker (2 wheeled) Transfers: Sit to/from Stand;Stand Pivot Transfers Sit to Stand: Supervision Stand pivot transfers: Supervision       General transfer comment: verbal cues for UE and LE positioning    Balance                                            ADL Overall ADL's : Needs assistance/impaired Eating/Feeding: Independent   Grooming: Wash/dry hands;Wash/dry face;Oral care;Brushing hair;Supervision/safety   Upper Body Bathing: Set up;Sitting   Lower Body Bathing: Sit to/from stand;Minimal assistance Lower Body Bathing Details (indicate cue type and reason): assist for Lt foot  Upper Body Dressing : Set up;Sitting   Lower Body Dressing: Minimal assistance;Sit to/from stand Lower Body Dressing Details (indicate cue type and reason): assist  to don Lt sock  Toilet Transfer: Supervision/safety;Ambulation;Comfort height toilet;BSC;RW;Grab bars   Toileting- Clothing Manipulation and Hygiene: Supervision/safety;Sit to/from Nurse, children's Details (indicate cue type and reason): pt able to verbalize safe technique  Functional mobility during ADLs: Supervision/safety;Rolling walker       Vision     Perception     Praxis      Pertinent Vitals/Pain Pain Assessment: 0-10 Pain Score: 8  Faces Pain Scale: Hurts little more Pain Location: Lt hip Pain Descriptors / Indicators: Aching;Grimacing Pain Intervention(s): Monitored during session;Repositioned;Ice applied     Hand Dominance Right   Extremity/Trunk Assessment Upper Extremity Assessment Upper Extremity Assessment: Overall WFL for tasks assessed   Lower Extremity Assessment Lower Extremity Assessment: Defer to PT evaluation       Communication Communication Communication: No difficulties   Cognition Arousal/Alertness: Awake/alert Behavior During Therapy: WFL for tasks assessed/performed Overall Cognitive Status: Within Functional Limits for tasks assessed                     General Comments       Exercises       Shoulder Instructions      Home Living Family/patient expects to be discharged to:: Private residence Living Arrangements: Spouse/significant other   Type of Home: House Home Access: Stairs to enter [employer] of Steps: 1 Entrance Stairs-Rails: None Home  Layout: Two level Alternate Level Stairs-Number of Steps: 7 Alternate Level Stairs-Rails: Right Bathroom Shower/Tub: Tub/shower unit;Curtain Shower/tub characteristics: Architectural technologist: Standard     Home Equipment: Walker - standard;Bedside commode;Shower seat          Prior Functioning/Environment Level of Independence: Independent with assistive device(s)             OT Diagnosis: Generalized weakness;Acute pain   OT Problem  List:     OT Treatment/Interventions:      OT Goals(Current goals can be found in the care plan section) Acute Rehab OT Goals Patient Stated Goal: to go home  OT Goal Formulation: All assessment and education complete, DC therapy  OT Frequency:     Barriers to D/C:            Co-evaluation              End of Session Equipment Utilized During Treatment: Gait belt Nurse Communication: Mobility status  Activity Tolerance: Patient tolerated treatment well Patient left: in chair;with call bell/phone within reach;with family/visitor present   Time: 1357-1410 OT Time Calculation (min): 13 min Charges:  OT General Charges $OT Visit: 1 Procedure OT Evaluation $OT Eval Low Complexity: 1 Procedure G-Codes:    Janet Potts October 04, 2015, 4:10 PM

## 2015-09-19 ENCOUNTER — Encounter (HOSPITAL_COMMUNITY): Payer: Self-pay | Admitting: Emergency Medicine

## 2015-09-19 ENCOUNTER — Emergency Department (HOSPITAL_COMMUNITY): Payer: 59

## 2015-09-19 ENCOUNTER — Emergency Department (HOSPITAL_COMMUNITY)
Admission: EM | Admit: 2015-09-19 | Discharge: 2015-09-19 | Disposition: A | Discharge status: Home / Self Care | Payer: 59 | Attending: Emergency Medicine | Admitting: Emergency Medicine

## 2015-09-19 DIAGNOSIS — Z79899 Other long term (current) drug therapy: Secondary | ICD-10-CM | POA: Diagnosis not present

## 2015-09-19 DIAGNOSIS — Z791 Long term (current) use of non-steroidal anti-inflammatories (NSAID): Secondary | ICD-10-CM | POA: Diagnosis not present

## 2015-09-19 DIAGNOSIS — M1990 Unspecified osteoarthritis, unspecified site: Secondary | ICD-10-CM | POA: Diagnosis not present

## 2015-09-19 DIAGNOSIS — N189 Chronic kidney disease, unspecified: Secondary | ICD-10-CM | POA: Insufficient documentation

## 2015-09-19 DIAGNOSIS — R1310 Dysphagia, unspecified: Secondary | ICD-10-CM | POA: Diagnosis not present

## 2015-09-19 DIAGNOSIS — Z96642 Presence of left artificial hip joint: Secondary | ICD-10-CM | POA: Diagnosis not present

## 2015-09-19 DIAGNOSIS — Z859 Personal history of malignant neoplasm, unspecified: Secondary | ICD-10-CM | POA: Diagnosis not present

## 2015-09-19 DIAGNOSIS — J029 Acute pharyngitis, unspecified: Secondary | ICD-10-CM | POA: Diagnosis present

## 2015-09-19 LAB — CBC WITH DIFFERENTIAL/PLATELET
Basophils Absolute: 0 10*3/uL (ref 0.0–0.1)
Basophils Relative: 0 %
Eosinophils Absolute: 0.2 10*3/uL (ref 0.0–0.7)
Eosinophils Relative: 2 %
HCT: 35.7 % — ABNORMAL LOW (ref 36.0–46.0)
HEMOGLOBIN: 11.6 g/dL — AB (ref 12.0–15.0)
LYMPHS ABS: 2.8 10*3/uL (ref 0.7–4.0)
LYMPHS PCT: 29 %
MCH: 29.2 pg (ref 26.0–34.0)
MCHC: 32.5 g/dL (ref 30.0–36.0)
MCV: 89.9 fL (ref 78.0–100.0)
MONO ABS: 1.3 10*3/uL — AB (ref 0.1–1.0)
MONOS PCT: 14 %
NEUTROS PCT: 55 %
Neutro Abs: 5.1 10*3/uL (ref 1.7–7.7)
Platelets: 307 10*3/uL (ref 150–400)
RBC: 3.97 MIL/uL (ref 3.87–5.11)
RDW: 13.4 % (ref 11.5–15.5)
WBC: 9.5 10*3/uL (ref 4.0–10.5)

## 2015-09-19 LAB — BASIC METABOLIC PANEL
ANION GAP: 8 (ref 5–15)
BUN: 9 mg/dL (ref 6–20)
CHLORIDE: 100 mmol/L — AB (ref 101–111)
CO2: 32 mmol/L (ref 22–32)
Calcium: 9.2 mg/dL (ref 8.9–10.3)
Creatinine, Ser: 0.62 mg/dL (ref 0.44–1.00)
GFR calc non Af Amer: >60 mL/min (ref >60)
GLUCOSE: 111 mg/dL — AB (ref 65–99)
Potassium: 4.4 mmol/L (ref 3.5–5.1)
Sodium: 140 mmol/L (ref 135–145)

## 2015-09-19 LAB — RAPID STREP SCREEN (MED CTR MEBANE ONLY): Streptococcus, Group A Screen (Direct): NEGATIVE

## 2015-09-19 MED ORDER — GI COCKTAIL ~~LOC~~
30.0000 mL | Freq: Once | ORAL | Status: AC
  Administered 2015-09-19: 30 mL via ORAL
  Filled 2015-09-19: qty 30

## 2015-09-19 MED ORDER — IOPAMIDOL (ISOVUE-300) INJECTION 61%
75.0000 mL | Freq: Once | INTRAVENOUS | Status: AC | PRN
  Administered 2015-09-19: 75 mL via INTRAVENOUS

## 2015-09-19 MED ORDER — OXYCODONE-ACETAMINOPHEN 5-325 MG PO TABS
2.0000 | ORAL_TABLET | Freq: Once | ORAL | Status: DC
  Filled 2015-09-19: qty 2

## 2015-09-19 MED ORDER — HYDROMORPHONE HCL 1 MG/ML IJ SOLN
1.0000 mg | Freq: Once | INTRAMUSCULAR | Status: AC
  Administered 2015-09-19: 1 mg via INTRAVENOUS
  Filled 2015-09-19: qty 1

## 2015-09-19 NOTE — ED Provider Notes (Signed)
CSN: [id number]     Arrival date & time 09/19/15  [id number] History  By signing my name below, I, Higinio Plan, attest that this documentation has been prepared under the direction and in the presence of Veryl Speak, MD . Electronically Signed: Higinio Plan, Scribe. 09/19/2015. 2:37 AM.   Chief Complaint  Janet Potts presents with  . Gastroesophageal Reflux  . Sore Throat   Janet Potts is a 55 y.o. female presenting with GERD and pharyngitis. The history is provided by the Janet Potts. No language interpreter was used.  Gastroesophageal Reflux This is a chronic problem. The current episode started more than 2 days ago. The problem has not changed since onset.Nothing aggravates the symptoms. Nothing relieves the symptoms. She has tried nothing for the symptoms.  Sore Throat  HPI Comments: Janet Potts is a 55 y.o. female with PMHx of GERD and PSHx of gastric bypass, who presents to the Emergency Department complaining of sore throat due to GERD that began four days ago and worsened today. Pt states associated symptoms of burning through her chest; she also notes a sensation of skin peeling in the back of her throat. Pt reports a recent PSHx of left hip replacement by Dr. Alvan Dame on 09/15/15. She notes she realized after her surgery that she couldn't swallow any medication or fluids due to her sore throat. Per husband, pt also experienced similar complications after her gastric bypass surgery in 2006. Pt denies any other complaints.   Past Medical History  Diagnosis Date  . 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 Gastric bypass status for obesity 06  . Anxiety     anxiety  . UTI (urinary tract infection)     ? irritated  . GERD (gastroesophageal reflux disease)     hx before gastric bypass  . Fibromyalgia     ?  Marland Kitchen Sinusitis   . PTSD (post-traumatic stress disorder)     due to numerous surgeries   . Chronic kidney disease     cyst on [place]   .  Arthritis   . Anemia     hx of at age 11   . Pneumonia     20's  . Cancer (HCC)     hx of precancerous cells being removed   . Chronic pain   . Neck fracture ([place]) 2011  . Complication of anesthesia     limited neck motion, left worse than right   . PONV (postoperative nausea and vomiting)     occ  . Constipation    Past Surgical History  Procedure Laterality Date  . Gastic  06    bypass  . Abdominal hysterectomy  98,08    partial, oophorectomy bil  . Tonsillectomy  74  . Ankle arthroscopy Right 99    cartilage  . Sinus exploration  00  . Gastricsurgery  10    repair intestinal rupture  . Cervical disc arthroplasty N/A 05/31/2012    Procedure: CERVICAL ANTERIOR DISC ARTHROPLASTY;  Surgeon: Charlie Pitter, MD;  Location: [place] [facility];  Service: Neurosurgery;  Laterality: N/A;  C-Arm  . Ankle surgery  2014    left ankle with titanium plate   . Gastric surgeyr       for intestinal blockage due to pouch rupturing   . Total hip arthroplasty Right 07/15/2013    Procedure: RIGHT TOTAL HIP ARTHROPLASTY ANTERIOR APPROACH;  Surgeon: Mauri Pole, MD;  Location: [facility];  Service: Orthopedics;  Laterality: Right;  . Appendectomy  07  . Cholecystectomy  07  . Hardware removal from left ankle  2015  . Total hip arthroplasty Left 09/14/2015    Procedure: LEFT TOTAL HIP ARTHROPLASTY ANTERIOR APPROACH;  Surgeon: Paralee Cancel, MD;  Location: [facility];  Service: Orthopedics;  Laterality: Left;   History reviewed. No pertinent family history. Social History  Substance Use Topics  . Smoking status: Never Smoker   . Smokeless tobacco: Never Used  . Alcohol Use: 3.4 oz/week    4 Glasses of wine, 2 Standard drinks or equivalent per week     Comment: occ   OB History    No data available     Review of Systems 10 systems reviewed and all are negative for acute change except as noted in the HPI.  Allergies  Cymbalta; Gabapentin; Other; Silvadene; Sulfa antibiotics; Zanaflex; Adhesive; and  Avelox  Home Medications   Prior to Admission medications   Medication Sig Start Date End Date Taking? Authorizing Provider  acetaminophen (TYLENOL) 325 MG tablet Take 650 mg by mouth every 6 (six) hours as needed (for pain.).   Yes Historical Provider, MD  ALPRAZolam Duanne Moron) 0.5 MG tablet Take 0.25-0.5 mg by mouth daily as needed for anxiety.    Yes Historical Provider, MD  amitriptyline (ELAVIL) 25 MG tablet Take 25 mg by mouth at bedtime as needed. Sleep. 08/06/15  Yes Historical Provider, MD  bisacodyl (DULCOLAX) 5 MG EC tablet Take 5 mg by mouth 2 (two) times a week.   Yes Historical Provider, MD  Calcium Carb-Cholecalciferol (CALCIUM 600+D3) 600-800 MG-UNIT TABS Take 1 tablet by mouth daily.   Yes Historical Provider, MD  Cyanocobalamin (VITAMIN B-12 SL) Place 2,400 mcg under the tongue daily.   Yes Historical Provider, MD  diclofenac sodium (VOLTAREN) 1 % GEL Apply 2 g topically at bedtime. pain 03/05/15  Yes Historical Provider, MD  diphenhydrAMINE (BENADRYL) 12.5 MG/5ML elixir Take 12.5 mg by mouth at bedtime as needed for itching.   Yes Historical Provider, MD  docusate sodium (COLACE) 100 MG capsule Take 1 capsule (100 mg total) by mouth 2 (two) times daily. 09/15/15  Yes Matthew Babish, PA-C  magnesium chloride (SLOW-MAG) 64 MG TBEC Take 1 tablet by mouth daily.   Yes Historical Provider, MD  magnesium hydroxide (MILK OF MAGNESIA) 400 MG/5ML suspension Take 30 mLs by mouth daily as needed for mild constipation.   Yes Historical Provider, MD  methocarbamol (ROBAXIN) 750 MG tablet Take 1 tablet (750 mg total) by mouth every 6 (six) hours as needed for muscle spasms. 09/15/15  Yes Danae Orleans, PA-C  Multiple Vitamin (MULTIVITAMIN WITH MINERALS) TABS Take 1 tablet by mouth daily after lunch. Chewable.   Yes Historical Provider, MD  ondansetron (ZOFRAN) 4 MG tablet Take 4 mg by mouth every 8 (eight) hours as needed for nausea.   Yes Historical Provider, MD  oxyCODONE 10 MG TABS Take 1-2  tablets (10-20 mg total) by mouth every 3 (three) hours as needed for severe pain. Janet Potts taking differently: Take 20-30 mg by mouth every 3 (three) hours as needed (for pain.).  09/15/15  Yes Danae Orleans, PA-C  pantoprazole (PROTONIX) 40 MG tablet Take 40 mg by mouth 2 (two) times daily as needed (acid reflux).  03/25/15 03/24/16 Yes Historical Provider, MD  polyethylene glycol (MIRALAX / GLYCOLAX) packet Take 17 g by mouth 2 (two) times daily. Janet Potts taking differently: Take 17 g by mouth 2 (two) times daily as needed for mild constipation.  09/15/15  Yes Danae Orleans, PA-C  ranitidine (ZANTAC) 150 MG tablet Take 150 mg by mouth 2 (two) times daily as needed for heartburn.   Yes Historical Provider, MD  rivaroxaban (XARELTO) 10 MG TABS tablet Take 1 tablet (10 mg total) by mouth daily. 09/15/15  Yes Danae Orleans, PA-C  Simethicone (GAS-X PO) Take 180 mg by mouth daily as needed (flatulence).    Yes Historical Provider, MD  traZODone (DESYREL) 50 MG tablet Take 50 mg by mouth at bedtime. 09/01/15  Yes Historical Provider, MD  vitamin C (ASCORBIC ACID) 500 MG tablet Take 1,000 mg by mouth every morning.    Yes Historical Provider, MD  acetaminophen (TYLENOL) 500 MG tablet Take 2 tablets (1,000 mg total) by mouth every 8 (eight) hours. 09/15/15   Danae Orleans, PA-C  estrogen-methylTESTOSTERone (ESTRATEST) 1.25-2.5 MG per tablet Take 1 tablet by mouth every evening.     Historical Provider, MD   BP 159/87 mmHg  Pulse 67  Temp(Src) 98.9 F (37.2 C) (Oral)  Resp 20  Ht 5\' 2"  (1.575 m)  Wt 198 lb (89.812 kg)  BMI 36.21 kg/m2  SpO2 99% Physical Exam  Constitutional: She is oriented to person, place, and time. She appears well-developed and well-nourished.  HENT:  Head: Normocephalic and atraumatic.  Eyes: Conjunctivae are normal. Right eye exhibits no discharge. Left eye exhibits no discharge.  Neck: Normal range of motion. Neck supple. No tracheal deviation present.  Cardiovascular:  Normal rate and regular rhythm.   Pulmonary/Chest: Effort normal and breath sounds normal.  Abdominal: Soft. She exhibits no distension. There is no tenderness. There is no guarding.  Musculoskeletal: She exhibits no edema.  Neurological: She is alert and oriented to person, place, and time.  Skin: Skin is warm. No rash noted.  Psychiatric: She has a normal mood and affect.  Nursing note and vitals reviewed.  ED Course  Procedures  DIAGNOSTIC STUDIES:  Oxygen Saturation is 99% on RA, normal by my interpretation.    COORDINATION OF CARE:  2:45 AM Discussed treatment plan, which includes GI cocktail and CT scan with pt at bedside and pt agreed to plan.  Labs Review Labs Reviewed  RAPID STREP SCREEN (NOT AT [facility])    Imaging Review No results found. I have personally reviewed and evaluated these images and lab results as part of my medical decision-making.   EKG Interpretation None      MDM   Final diagnoses:  None    Janet Potts is a 55 year old female with history of obesity with previous gastric bypass, anxiety, chronic pain, and fibromyalgia with recent left total hip replacement. She presents today with complaints of difficulty swallowing. She states that she can "hardly get liquids and solids down". She reports burning in her throat. Her physical examination is unremarkable and laboratory studies are all essentially normal. She was given a GI cocktail with some relief. She also underwent a CT scan of the neck which revealed no abnormalities.  When I returned to the room to discuss the results with her, she appeared to being quite anxious and agitated. I have not witnessed her experience any difficulty with swallowing and she appears to be handling her secretions quite well. I explained to her that her imaging studies were unremarkable and that she was likely experiencing some acid reflux. I also explained that I believed there to be a component of anxiety contributing to the  severity of her symptoms. Her immediate response was "just get this IV out of me and get me  out of here". I again tried to discuss this with her, but by this time she was quite angry and annoyed with me.   Her cell phone than rang and the Janet Potts had a conversation with another physician telling them that I "just told her she was crazy". She also told this other physician that I told her there was "nothing wrong with her and there was nothing we could do for her". I most certainly did not say either one of these things. She also told this person that I "just let her sit in pain for hours and hours", when in fact I was never informed that she was in significant discomfort and there was never a request for pain medication conveyed to me prior to this. While she was speaking with the person on the phone, I asked the husband to let me know what I could do to help them. I explained to him I could assist with a transfer to another facility or speak with our gastroenterologists and admitting physicians about the possibility of admission and endoscopy. I then excused myself from the room.  Shortly afterward, the nursing assistant informed me that the Janet Potts would like to be transferred to the [facility] b[facility]. The Janet Potts gave me the phone number for this facility and I went to the physician's office to make these arrangements. After I was on hold for several minutes, the nursing assistant informed me that the Janet Potts again had decided that she just wanted to be discharged and she would make these arrangements on their own. At this point, I granted her request for discharge and she left the department.  I personally performed the services described in this documentation, which was scribed in my presence. The recorded information has been reviewed and is accurate.       Veryl Speak, MD 09/19/15 0630

## 2015-09-19 NOTE — Discharge Instructions (Signed)

## 2015-09-19 NOTE — ED Notes (Signed)
Patient is complaining of burning in her throat. Patient states she takes Protonix but just got back on it three days ago.

## 2015-09-19 NOTE — ED Notes (Signed)
Pt in restroom.  Will get labs upon arrival back to room.

## 2015-09-20 NOTE — Discharge Summary (Signed)
Physician Discharge Summary  Patient ID: Janet Potts MRN: [id number] DOB/AGE: 07-29-1960 55 y.o.  Admit date: 09/14/2015 Discharge date: 09/15/2015   Procedures:  Procedure(s) (LRB): LEFT TOTAL HIP ARTHROPLASTY ANTERIOR APPROACH (Left)  Attending Physician:  Dr. Paralee Cancel   Admission Diagnoses:   Left hip primary OA / pain  Discharge Diagnoses:  Principal Problem:   S/P left THA, AA  Past Medical History  Diagnosis Date  . 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 Gastric bypass status for obesity 06  . Anxiety     anxiety  . UTI (urinary tract infection)     ? irritated  . GERD (gastroesophageal reflux disease)     hx before gastric bypass  . Fibromyalgia     ?  Marland Kitchen Sinusitis   . PTSD (post-traumatic stress disorder)     due to numerous surgeries   . Chronic kidney disease     cyst on kindey   . Arthritis   . Anemia     hx of at age 33   . Pneumonia     20's  . Cancer (HCC)     hx of precancerous cells being removed   . Chronic pain   . Neck fracture ([place]) 2011  . Complication of anesthesia     limited neck motion, left worse than right   . PONV (postoperative nausea and vomiting)     occ  . Constipation     HPI:    Janet Potts, 55 y.o. female, has a history of pain and functional disability in the left hip(s) due to arthritis and patient has failed non-surgical conservative treatments for greater than 12 weeks to include NSAID's and/or analgesics, corticosteriod injections, use of assistive devices and activity modification. Onset of symptoms was gradual starting 2+ years ago with gradually worsening course since that time.The patient noted prior procedures of the hip to include arthroplasty on the right hip per Dr. Alvan Dame 2015. Patient currently rates pain in the left hip at 10 out of 10 with activity. Patient has night pain, worsening of pain with activity and weight bearing, trendelenberg  gait, pain that interfers with activities of daily living and pain with passive range of motion. Patient has evidence of periarticular osteophytes and joint space narrowing by imaging studies. This condition presents safety issues increasing the risk of falls. There is no current active infection. Risks, benefits and expectations were discussed with the patient. Risks including but not limited to the risk of anesthesia, blood clots, nerve damage, blood vessel damage, failure of the prosthesis, infection and up to and including death. Patient understand the risks, benefits and expectations and wishes to proceed with surgery.   PCP: Cher Nakai, MD   Discharged Condition: good  Hospital Course:  Patient underwent the above stated procedure on 09/14/2015. Patient tolerated the procedure well and brought to the recovery room in good condition and subsequently to the floor.  POD #1 BP: 99/50 ; Pulse: 61 ; Temp: 97.3 F (36.6 C) ; Resp: 19 Patient reports pain as mild, pain controlled. States that she didn't have a good night. Very anxious and states that she is in pain with both her hip and neck (neck has been long time issues with previous trauma.) We have discussed options and changed up some of her medications. She and her husband state that she is anxious and this is not helping her in her pain. If she does well with PT she  may discharge home.  Dorsiflexion/plantar flexion intact, incision: dressing C/D/I, no cellulitis present and compartment soft.   LABS  Basename    HGB     11.5  HCT     34.3    Discharge Exam: General appearance: alert, cooperative and no distress Extremities: Homans sign is negative, no sign of DVT, no edema, redness or tenderness in the calves or thighs and no ulcers, gangrene or trophic changes  Disposition: Home with follow up in 2 weeks   Follow-up Information    Follow up with Mauri Pole, MD. Schedule an appointment as soon as possible for a visit in 2  weeks.   Specialty:  Orthopedic Surgery   Contact information:   [address] [place] [address] [phone]       Follow up with [facility].   Why:  HOME HEALTH PHYSICAL THERAPY   Contact information:   [address] [place] [address] [phone]       Discharge Instructions    Call MD / Call 911    Complete by:  As directed   If you experience chest pain or shortness of breath, CALL 911 and be transported to the hospital emergency room.  If you develope a fever above 101 F, pus (white drainage) or increased drainage or redness at the wound, or calf pain, call your surgeon's office.     Change dressing    Complete by:  As directed   Maintain surgical dressing until follow up in the clinic. If the edges start to pull up, may reinforce with tape. If the dressing is no longer working, may remove and cover with gauze and tape, but must keep the area dry and clean.  Call with any questions or concerns.     Constipation Prevention    Complete by:  As directed   Drink plenty of fluids.  Prune juice may be helpful.  You may use a stool softener, such as Colace (over the counter) 100 mg twice a day.  Use MiraLax (over the counter) for constipation as needed.     Diet - low sodium heart healthy    Complete by:  As directed      Discharge instructions    Complete by:  As directed   Maintain surgical dressing until follow up in the clinic. If the edges start to pull up, may reinforce with tape. If the dressing is no longer working, may remove and cover with gauze and tape, but must keep the area dry and clean.  Follow up in 2 weeks at [facility] Call with any questions or concerns.     Increase activity slowly as tolerated    Complete by:  As directed   Weight bearing as tolerated with assist device (walker, cane, etc) as directed, use it as long as suggested by your surgeon or therapist, typically at least 4-6 weeks.     TED hose     Complete by:  As directed   Use stockings (TED hose) for 2 weeks on both leg(s).  You may remove them at night for sleeping.             Medication List    STOP taking these medications        amoxicillin-clavulanate 875-125 MG tablet  Commonly known as:  AUGMENTIN     ferrous sulfate 325 (65 FE) MG tablet     gabapentin 300 MG capsule  Commonly known as:  NEURONTIN  HYDROcodone-acetaminophen 10-325 MG tablet  Commonly known as:  NORCO     RESTASIS 0.05 % ophthalmic emulsion  Generic drug:  cycloSPORINE      TAKE these medications        acetaminophen 500 MG tablet  Commonly known as:  TYLENOL  Take 2 tablets (1,000 mg total) by mouth every 8 (eight) hours.     ALPRAZolam 0.5 MG tablet  Commonly known as:  XANAX  Take 0.25-0.5 mg by mouth daily as needed for anxiety.     amitriptyline 25 MG tablet  Commonly known as:  ELAVIL  Take 25 mg by mouth at bedtime as needed. Sleep.     bisacodyl 5 MG EC tablet  Commonly known as:  DULCOLAX  Take 5 mg by mouth 2 (two) times a week.     CALCIUM 600+D3 600-800 MG-UNIT Tabs  Generic drug:  Calcium Carb-Cholecalciferol  Take 1 tablet by mouth daily.     diclofenac sodium 1 % Gel  Commonly known as:  VOLTAREN  Apply 2 g topically at bedtime. pain     diphenhydrAMINE 12.5 MG/5ML elixir  Commonly known as:  BENADRYL  Take 12.5 mg by mouth at bedtime as needed for itching.     docusate sodium 100 MG capsule  Commonly known as:  COLACE  Take 1 capsule (100 mg total) by mouth 2 (two) times daily.     estrogen-methylTESTOSTERone 1.25-2.5 MG tablet  Commonly known as:  ESTRATEST  Take 1 tablet by mouth every evening.     GAS-X PO  Take 180 mg by mouth daily as needed (flatulence).     magnesium chloride 64 MG Tbec SR tablet  Commonly known as:  SLOW-MAG  Take 1 tablet by mouth daily.     magnesium hydroxide 400 MG/5ML suspension  Commonly known as:  MILK OF MAGNESIA  Take 30 mLs by mouth daily as needed for  mild constipation.     methocarbamol 750 MG tablet  Commonly known as:  ROBAXIN  Take 1 tablet (750 mg total) by mouth every 6 (six) hours as needed for muscle spasms.     multivitamin with minerals Tabs tablet  Take 1 tablet by mouth daily after lunch. Chewable.     ondansetron 4 MG tablet  Commonly known as:  ZOFRAN  Take 4 mg by mouth every 8 (eight) hours as needed for nausea.     Oxycodone HCl 10 MG Tabs  Take 1-2 tablets (10-20 mg total) by mouth every 3 (three) hours as needed for severe pain.     pantoprazole 40 MG tablet  Commonly known as:  PROTONIX  Take 40 mg by mouth 2 (two) times daily as needed (acid reflux).     polyethylene glycol packet  Commonly known as:  MIRALAX / GLYCOLAX  Take 17 g by mouth 2 (two) times daily.     rivaroxaban 10 MG Tabs tablet  Commonly known as:  XARELTO  Take 1 tablet (10 mg total) by mouth daily.     VITAMIN B-12 SL  Place 2,400 mcg under the tongue daily.     vitamin C 500 MG tablet  Commonly known as:  ASCORBIC ACID  Take 1,000 mg by mouth every morning.         Signed: West Pugh. Hailyn Zarr   PA-C  09/20/2015, 2:38 PM

## 2015-09-21 LAB — CULTURE, GROUP A STREP (THRC)

## 2015-09-26 ENCOUNTER — Encounter (HOSPITAL_COMMUNITY): Payer: Self-pay | Admitting: Emergency Medicine

## 2015-09-26 ENCOUNTER — Emergency Department (HOSPITAL_COMMUNITY)
Admission: EM | Admit: 2015-09-26 | Discharge: 2015-09-26 | Disposition: A | Discharge status: Home / Self Care | Payer: 59 | Attending: Emergency Medicine | Admitting: Emergency Medicine

## 2015-09-26 ENCOUNTER — Emergency Department (HOSPITAL_COMMUNITY): Payer: 59

## 2015-09-26 DIAGNOSIS — Z859 Personal history of malignant neoplasm, unspecified: Secondary | ICD-10-CM | POA: Insufficient documentation

## 2015-09-26 DIAGNOSIS — Z96642 Presence of left artificial hip joint: Secondary | ICD-10-CM | POA: Diagnosis not present

## 2015-09-26 DIAGNOSIS — G8918 Other acute postprocedural pain: Secondary | ICD-10-CM | POA: Diagnosis not present

## 2015-09-26 DIAGNOSIS — Z79899 Other long term (current) drug therapy: Secondary | ICD-10-CM | POA: Diagnosis not present

## 2015-09-26 DIAGNOSIS — M1990 Unspecified osteoarthritis, unspecified site: Secondary | ICD-10-CM | POA: Insufficient documentation

## 2015-09-26 DIAGNOSIS — M25552 Pain in left hip: Secondary | ICD-10-CM | POA: Diagnosis present

## 2015-09-26 DIAGNOSIS — N189 Chronic kidney disease, unspecified: Secondary | ICD-10-CM | POA: Insufficient documentation

## 2015-09-26 MED ORDER — HYDROMORPHONE HCL 1 MG/ML IJ SOLN
1.0000 mg | Freq: Once | INTRAMUSCULAR | Status: AC
  Administered 2015-09-26: 1 mg via INTRAVENOUS
  Filled 2015-09-26: qty 1

## 2015-09-26 MED ORDER — FENTANYL CITRATE (PF) 100 MCG/2ML IJ SOLN
100.0000 ug | Freq: Once | INTRAMUSCULAR | Status: AC
  Administered 2015-09-26: 100 ug via INTRAVENOUS
  Filled 2015-09-26: qty 2

## 2015-09-26 NOTE — ED Notes (Signed)
Bed: RESA Expected date:  Expected time:  Means of arrival:  Comments: 55 yo F  Possible hip dislocation

## 2015-09-26 NOTE — ED Notes (Addendum)
Pt transported from home c/o L hip pain. Total hip replacement 6/15 by Dr. Alvan Dame, pt states she put weight onto L hip @0330 , felt a slip, pt now having severe pain to L hip. EMS unsure if shortening or rotation is present, pt was able to use walker at home.  No shortening or rotation noted, pt states she was able to get up post fall, pt did take Oxy IR 10mg  x 3 after fall for pain. Pt able to move leg, strong pulse.

## 2015-09-26 NOTE — ED Provider Notes (Signed)
CSN: [id number]     Arrival date & time 09/26/15  0455 History   First MD Initiated Contact with Patient 09/26/15 [phone]     Chief Complaint  Patient presents with  . Hip Injury     Patient is a 55 y.o. female presenting with hip pain. The history is provided by the patient.  Hip Pain This is a new problem. The current episode started 6 to 12 hours ago. The problem occurs constantly. The problem has been gradually worsening. Pertinent negatives include no chest pain, no abdominal pain and no shortness of breath. Exacerbated by: movement. Nothing relieves the symptoms.  Patient presents from home for left hip pain She had recent left hip replacement (09/14/15) She has been at home and walking with walker She got up in the middle of the night and while sitting and moving her legs she felt a "pop" in her left hip No falls/trauma She had immediate pain in left hip No numbness/weakness to left LE She feels it was out of place No other complaints   Past Medical History  Diagnosis Date  . 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 Gastric bypass status for obesity 06  . Anxiety     anxiety  . UTI (urinary tract infection)     ? irritated  . GERD (gastroesophageal reflux disease)     hx before gastric bypass  . Fibromyalgia     ?  Marland Kitchen Sinusitis   . PTSD (post-traumatic stress disorder)     due to numerous surgeries   . Chronic kidney disease     cyst on kindey   . Arthritis   . Anemia     hx of at age 36   . Pneumonia     20's  . Cancer (HCC)     hx of precancerous cells being removed   . Chronic pain   . Neck fracture ([place]) 2011  . Complication of anesthesia     limited neck motion, left worse than right   . PONV (postoperative nausea and vomiting)     occ  . Constipation    Past Surgical History  Procedure Laterality Date  . Gastic  06    bypass  . Abdominal hysterectomy  98,08    partial, oophorectomy  bil  . Tonsillectomy  74  . Ankle arthroscopy Right 99    cartilage  . Sinus exploration  00  . Gastricsurgery  10    repair intestinal rupture  . Cervical disc arthroplasty N/A 05/31/2012    Procedure: CERVICAL ANTERIOR DISC ARTHROPLASTY;  Surgeon: Charlie Pitter, MD;  Location: [facility];  Service: Neurosurgery;  Laterality: N/A;  C-Arm  . Ankle surgery  2014    left ankle with titanium plate   . Gastric surgeyr       for intestinal blockage due to pouch rupturing   . Total hip arthroplasty Right 07/15/2013    Procedure: RIGHT TOTAL HIP ARTHROPLASTY ANTERIOR APPROACH;  Surgeon: Mauri Pole, MD;  Location: [facility] ORS;  Service: Orthopedics;  Laterality: Right;  . Appendectomy  07  . Cholecystectomy  07  . Hardware removal from left ankle  2015  . Total hip arthroplasty Left 09/14/2015    Procedure: LEFT TOTAL HIP ARTHROPLASTY ANTERIOR APPROACH;  Surgeon: Paralee Cancel, MD;  Location: [facility] ORS;  Service: Orthopedics;  Laterality: Left;   No family history on file. Social History  Substance Use Topics  .  Smoking status: Never Smoker   . Smokeless tobacco: Never Used  . Alcohol Use: 3.4 oz/week    4 Glasses of wine, 2 Standard drinks or equivalent per week     Comment: occ   OB History    No data available     Review of Systems  Constitutional: Negative for fever.  Respiratory: Negative for shortness of breath.   Cardiovascular: Negative for chest pain.  Gastrointestinal: Negative for abdominal pain.  Musculoskeletal: Positive for arthralgias.  Neurological: Negative for weakness and numbness.  All other systems reviewed and are negative.     Allergies  Cymbalta; Gabapentin; Other; Silvadene; Sulfa antibiotics; Zanaflex; Adhesive; and Avelox  Home Medications   Prior to Admission medications   Medication Sig Start Date End Date Taking? Authorizing Provider  acetaminophen (TYLENOL) 325 MG tablet Take 650 mg by mouth every 6 (six) hours as needed (for pain.).   Yes  Historical Provider, MD  acetaminophen (TYLENOL) 500 MG tablet Take 2 tablets (1,000 mg total) by mouth every 8 (eight) hours. 09/15/15  Yes Danae Orleans, PA-C  ALPRAZolam Duanne Moron) 0.5 MG tablet Take 0.25-0.5 mg by mouth daily as needed for anxiety.    Yes Historical Provider, MD  amitriptyline (ELAVIL) 25 MG tablet Take 25 mg by mouth at bedtime as needed. Sleep. 08/06/15  Yes Historical Provider, MD  amoxicillin-clavulanate (AUGMENTIN) 875-125 MG tablet Take 1 tablet by mouth 2 (two) times daily. For UTI. 7 DAY THERAPY COURSE PATIENT HAS COMPLETED 2 DAYS OF THERAPY. 09/22/15  Yes Historical Provider, MD  bisacodyl (DULCOLAX) 5 MG EC tablet Take 5 mg by mouth 2 (two) times a week.   Yes Historical Provider, MD  Calcium Carb-Cholecalciferol (CALCIUM 600+D3) 600-800 MG-UNIT TABS Take 1 tablet by mouth daily.   Yes Historical Provider, MD  Cyanocobalamin (VITAMIN B-12 SL) Place 2,400 mcg under the tongue daily.   Yes Historical Provider, MD  diclofenac sodium (VOLTAREN) 1 % GEL Apply 2 g topically at bedtime. pain 03/05/15  Yes Historical Provider, MD  diphenhydrAMINE (BENADRYL) 12.5 MG/5ML elixir Take 12.5 mg by mouth at bedtime as needed for itching.   Yes Historical Provider, MD  docusate sodium (COLACE) 100 MG capsule Take 1 capsule (100 mg total) by mouth 2 (two) times daily. 09/15/15  Yes Matthew Babish, PA-C  estrogen-methylTESTOSTERone (ESTRATEST) 1.25-2.5 MG per tablet Take 1 tablet by mouth every evening.    Yes Historical Provider, MD  fluticasone (FLONASE) 50 MCG/ACT nasal spray Place 1-2 sprays into both nostrils daily as needed. For allergies. 09/21/15  Yes Historical Provider, MD  magnesium chloride (SLOW-MAG) 64 MG TBEC Take 1 tablet by mouth daily.   Yes Historical Provider, MD  magnesium hydroxide (MILK OF MAGNESIA) 400 MG/5ML suspension Take 30 mLs by mouth daily as needed for mild constipation.   Yes Historical Provider, MD  methocarbamol (ROBAXIN) 750 MG tablet Take 1 tablet (750 mg  total) by mouth every 6 (six) hours as needed for muscle spasms. 09/15/15  Yes Danae Orleans, PA-C  Multiple Vitamin (MULTIVITAMIN WITH MINERALS) TABS Take 1 tablet by mouth daily after lunch. Chewable.   Yes Historical Provider, MD  ondansetron (ZOFRAN) 4 MG tablet Take 4 mg by mouth every 8 (eight) hours as needed for nausea.   Yes Historical Provider, MD  oxyCODONE 10 MG TABS Take 1-2 tablets (10-20 mg total) by mouth every 3 (three) hours as needed for severe pain. Patient taking differently: Take 20-30 mg by mouth every 3 (three) hours as needed (for pain.).  09/15/15  Yes Danae Orleans, PA-C  pantoprazole (PROTONIX) 40 MG tablet Take 40 mg by mouth 2 (two) times daily as needed (acid reflux).  03/25/15 03/24/16 Yes Historical Provider, MD  polyethylene glycol (MIRALAX / GLYCOLAX) packet Take 17 g by mouth 2 (two) times daily. Patient taking differently: Take 17 g by mouth 2 (two) times daily as needed for mild constipation.  09/15/15  Yes Matthew Babish, PA-C  RA ALLERGY RELIEF 10 MG tablet Take 10 mg by mouth daily as needed. For allergies. 09/21/15  Yes Historical Provider, MD  ranitidine (ZANTAC) 150 MG tablet Take 150 mg by mouth 2 (two) times daily as needed for heartburn.   Yes Historical Provider, MD  rivaroxaban (XARELTO) 10 MG TABS tablet Take 1 tablet (10 mg total) by mouth daily. 09/15/15  Yes Danae Orleans, PA-C  Simethicone (GAS-X PO) Take 180 mg by mouth daily as needed (flatulence).    Yes Historical Provider, MD  traZODone (DESYREL) 50 MG tablet Take 50 mg by mouth at bedtime as needed for sleep.  09/01/15  Yes Historical Provider, MD  vitamin C (ASCORBIC ACID) 500 MG tablet Take 1,000 mg by mouth every morning.    Yes Historical Provider, MD   BP 124/63 mmHg  Pulse 62  Temp(Src) 98 F (36.7 C)  Resp 14  Ht 5\' 2"  (1.575 m)  Wt 89.812 kg  BMI 36.21 kg/m2  SpO2 97% Physical Exam CONSTITUTIONAL: Well developed/well nourished, uncomfortable appearing HEAD:  Normocephalic/atraumatic EYES: EOMI/PERRL ENMT: Mucous membranes moist NECK: supple no meningeal signs SPINE/BACK:entire spine nontender CV: S1/S2 noted, no murmurs/rubs/gallops noted LUNGS: Lungs are clear to auscultation bilaterally, no apparent distress ABDOMEN: soft, nontender, no rebound or guarding, bowel sounds noted throughout abdomen GU:no cva tenderness NEURO: Pt is awake/alert/appropriate, moves all extremitiesx4.  No facial droop.  No focal motor/sensory deficit in left LE EXTREMITIES: pulses normal/equal, full ROM.  Both LE are equally aligned, no rotation or deformity.  Bandage to left hip in place, no drainage or significant erythema noted around bandage.  She has tenderness with ROM of left hip.  Pelvis stable.  Distal pulses equal/intact No shortening of left LE SKIN: warm, color normal PSYCH: anxious  ED Course  Procedures  Imaging Review Dg Hip Unilat With Pelvis 2-3 Views Left  09/26/2015  CLINICAL DATA:  Slipped sensation LEFT hip, severe LEFT hip pain. Status post hip replacement June 13th. EXAM: DG HIP (WITH OR WITHOUT PELVIS) 2-3V LEFT COMPARISON:  Pelvic radiograph September 14, 2015 FINDINGS: Status post bilateral hip total arthroplasties with intact well-seated femoral and acetabular components. No periprosthetic lucency. No destructive bony lesions. Phleboliths project in the pelvis. Mild vascular calcifications. Resolution of LEFT hip subcutaneous gas. IMPRESSION: No acute fracture deformity or dislocation. Status post bilateral hip total arthroplasties without radiographic findings of hardware failure. Electronically Signed   By: Elon Alas M.D.   On: 09/26/2015 06:10   I have personally reviewed and evaluated these images  results as part of my medical decision-making.  Medications  HYDROmorphone (DILAUDID) injection 1 mg (not administered)  fentaNYL (SUBLIMAZE) injection 100 mcg (100 mcg Intravenous Given 09/26/15 0545)  HYDROmorphone (DILAUDID) injection 1  mg (1 mg Intravenous Given 09/26/15 0750)  HYDROmorphone (DILAUDID) injection 1 mg (1 mg Intravenous Given 09/26/15 0830)     Imaging negative No physical exam findings to suggest dislocation or postop complication Pt can ambulate here and she feels improved No signs of acute post op infection She reports some muscle spasms in buttocks but no bruising/erythema noted She  has ortho f/u this week   MDM   Final diagnoses:  Acute post-operative pain    Nursing notes including past medical history and social history reviewed and considered in documentation. xrays/imaging reviewed by myself and considered during evaluation     Ripley Fraise, MD 09/26/15 1007

## 2015-09-26 NOTE — ED Notes (Signed)
Pt yelling out in pain, pt requesting to be have a foley placed, pt states pain too severe for bed pan. Attempts made to explain to patient our policy on catheters, pt still requesting cath.  Pts husband has pts Oxy IR bottle. Script is for 1-3 every 3 hours. Clarified with patient, she is taking 3 every 3 hours with Robaxin. Pt received 120 tab on 6/20, 13 tabs left at this time.

## 2015-09-26 NOTE — ED Notes (Signed)
Pt states pain slightly better at this time.

## 2015-09-26 NOTE — ED Notes (Signed)
NT ambulated patient in hallway with walker.

## 2015-09-26 NOTE — ED Notes (Signed)
Pt resting on stretcher with eyes closed, NAD, RR even and unlabored.

## 2015-09-26 NOTE — ED Notes (Signed)
Discharge instructions and follow up care reviewed with patient. Patient verbalized understanding. 

## 2015-12-15 ENCOUNTER — Emergency Department (HOSPITAL_COMMUNITY): Payer: 59

## 2015-12-15 ENCOUNTER — Encounter (HOSPITAL_COMMUNITY): Payer: Self-pay

## 2015-12-15 ENCOUNTER — Emergency Department (HOSPITAL_COMMUNITY)
Admission: EM | Admit: 2015-12-15 | Discharge: 2015-12-15 | Disposition: A | Discharge status: Home / Self Care | Payer: 59 | Attending: Emergency Medicine | Admitting: Emergency Medicine

## 2015-12-15 DIAGNOSIS — N390 Urinary tract infection, site not specified: Secondary | ICD-10-CM | POA: Diagnosis not present

## 2015-12-15 DIAGNOSIS — R1032 Left lower quadrant pain: Secondary | ICD-10-CM | POA: Diagnosis present

## 2015-12-15 DIAGNOSIS — Z85828 Personal history of other malignant neoplasm of skin: Secondary | ICD-10-CM | POA: Insufficient documentation

## 2015-12-15 DIAGNOSIS — Z79899 Other long term (current) drug therapy: Secondary | ICD-10-CM | POA: Insufficient documentation

## 2015-12-15 DIAGNOSIS — R109 Unspecified abdominal pain: Secondary | ICD-10-CM

## 2015-12-15 DIAGNOSIS — R399 Unspecified symptoms and signs involving the genitourinary system: Secondary | ICD-10-CM

## 2015-12-15 DIAGNOSIS — N189 Chronic kidney disease, unspecified: Secondary | ICD-10-CM | POA: Insufficient documentation

## 2015-12-15 LAB — CBC WITH DIFFERENTIAL/PLATELET
BASOS ABS: 0.1 10*3/uL (ref 0.0–0.1)
BASOS PCT: 1 %
EOS PCT: 4 %
Eosinophils Absolute: 0.2 10*3/uL (ref 0.0–0.7)
HCT: 36.6 % (ref 36.0–46.0)
Hemoglobin: 12.1 g/dL (ref 12.0–15.0)
LYMPHS PCT: 46 %
Lymphs Abs: 2.9 10*3/uL (ref 0.7–4.0)
MCH: 27.6 pg (ref 26.0–34.0)
MCHC: 33.1 g/dL (ref 30.0–36.0)
MCV: 83.4 fL (ref 78.0–100.0)
MONO ABS: 0.8 10*3/uL (ref 0.1–1.0)
Monocytes Relative: 14 %
Neutro Abs: 2.2 10*3/uL (ref 1.7–7.7)
Neutrophils Relative %: 35 %
PLATELETS: 272 10*3/uL (ref 150–400)
RBC: 4.39 MIL/uL (ref 3.87–5.11)
RDW: 13.9 % (ref 11.5–15.5)
WBC: 6.2 10*3/uL (ref 4.0–10.5)

## 2015-12-15 LAB — URINE MICROSCOPIC-ADD ON: RBC / HPF: NONE SEEN RBC/hpf (ref 0–5)

## 2015-12-15 LAB — URINALYSIS, ROUTINE W REFLEX MICROSCOPIC
Bilirubin Urine: NEGATIVE
Glucose, UA: NEGATIVE mg/dL
Hgb urine dipstick: NEGATIVE
Ketones, ur: NEGATIVE mg/dL
LEUKOCYTES UA: NEGATIVE
Nitrite: POSITIVE — AB
PROTEIN: NEGATIVE mg/dL
Specific Gravity, Urine: 1.013 (ref 1.005–1.030)
pH: 6 (ref 5.0–8.0)

## 2015-12-15 LAB — COMPREHENSIVE METABOLIC PANEL
ALT: 20 U/L (ref 14–54)
ANION GAP: 6 (ref 5–15)
AST: 37 U/L (ref 15–41)
Albumin: 4.3 g/dL (ref 3.5–5.0)
Alkaline Phosphatase: 64 U/L (ref 38–126)
BUN: 8 mg/dL (ref 6–20)
CHLORIDE: 104 mmol/L (ref 101–111)
CO2: 28 mmol/L (ref 22–32)
Calcium: 9.4 mg/dL (ref 8.9–10.3)
Creatinine, Ser: 0.65 mg/dL (ref 0.44–1.00)
GFR calc Af Amer: >60 mL/min (ref >60)
Glucose, Bld: 99 mg/dL (ref 65–99)
POTASSIUM: 4.4 mmol/L (ref 3.5–5.1)
Sodium: 138 mmol/L (ref 135–145)
Total Bilirubin: 0.5 mg/dL (ref 0.3–1.2)
Total Protein: 7.4 g/dL (ref 6.5–8.1)

## 2015-12-15 LAB — LIPASE, BLOOD: LIPASE: 35 U/L (ref 11–51)

## 2015-12-15 MED ORDER — ONDANSETRON HCL 4 MG/2ML IJ SOLN
4.0000 mg | Freq: Once | INTRAMUSCULAR | Status: AC
  Administered 2015-12-15: 4 mg via INTRAVENOUS
  Filled 2015-12-15: qty 2

## 2015-12-15 MED ORDER — OXYCODONE HCL 5 MG PO TABS
10.0000 mg | ORAL_TABLET | Freq: Once | ORAL | Status: AC
  Administered 2015-12-15: 10 mg via ORAL
  Filled 2015-12-15: qty 2

## 2015-12-15 MED ORDER — IOPAMIDOL (ISOVUE-300) INJECTION 61%
100.0000 mL | Freq: Once | INTRAVENOUS | Status: AC | PRN
  Administered 2015-12-15: 100 mL via INTRAVENOUS

## 2015-12-15 MED ORDER — SODIUM CHLORIDE 0.9 % IV BOLUS (SEPSIS)
1000.0000 mL | Freq: Once | INTRAVENOUS | Status: AC
  Administered 2015-12-15: 1000 mL via INTRAVENOUS

## 2015-12-15 MED ORDER — FENTANYL CITRATE (PF) 100 MCG/2ML IJ SOLN
50.0000 ug | Freq: Once | INTRAMUSCULAR | Status: AC
  Administered 2015-12-15: 50 ug via INTRAVENOUS
  Filled 2015-12-15: qty 2

## 2015-12-15 MED ORDER — IOPAMIDOL (ISOVUE-300) INJECTION 61%
30.0000 mL | Freq: Once | INTRAVENOUS | Status: AC | PRN
  Administered 2015-12-15: 30 mL via ORAL

## 2015-12-15 NOTE — ED Triage Notes (Signed)
She c/o bilat. Flank pain--recently dx with uti and is on 2nd antibiotic. She is very fearful of "kidney failure". I assure her this is unlikely. Her husband is with her.

## 2015-12-15 NOTE — ED Notes (Signed)
Patient was alert, oriented and stable upon discharge. RN went over AVS and patient had no further questions.  

## 2015-12-15 NOTE — ED Provider Notes (Signed)
[place] DEPT Provider Note   CSN: [id number] Arrival date & time: 12/15/15  1127     History   Chief Complaint Chief Complaint  Patient presents with  . Flank Pain    HPI Janet Potts is a 55 y.o. female.  HPI   Having some trouble not urinating fully. Last week was in ED in [facility] for ankle pain.  Did urinalysis at that time and said had UTI and gave cipro. Was on it until Monday and feeling worse. Took blood and did urine culture and placed on cefdinir.  Feeling chills, doubled over in pain.  Nausea, dry heaves.  Pain in bladder and kidneys per pt. Right and left side of back. Low grade off and on last week then severe. Hx of chronic pain, multiple surgeries.   Past Medical History:  Diagnosis Date  . Anemia    hx of at age 29   . Anxiety    anxiety  . Arthritis   . Cancer (HCC)    hx of precancerous cells being removed   . Chronic kidney disease    cyst on [place]   . Chronic pain   . Complication of anesthesia    limited neck motion, left worse than right   . Constipation   . Fibromyalgia    ?  . Gastric bypass status for obesity 06  . GERD (gastroesophageal reflux disease)    hx before gastric bypass  . 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 Neck fracture ([place]) 2011  . Pneumonia    20's  . PONV (postoperative nausea and vomiting)    occ  . PTSD (post-traumatic stress disorder)    due to numerous surgeries   . Sinusitis   . UTI (urinary tract infection)    ? irritated    Patient Active Problem List   Diagnosis Date Noted  . S/P left THA, AA 09/14/2015  . Expected blood loss anemia 07/16/2013  . Obese 07/16/2013  . Herniation of cervical intervertebral disc 05/31/2012    Past Surgical History:  Procedure Laterality Date  . ABDOMINAL HYSTERECTOMY  98,08   partial, oophorectomy bil  . ANKLE ARTHROSCOPY Right 99   cartilage  . ANKLE SURGERY  2014   left ankle with titanium  plate   . APPENDECTOMY  07  . CERVICAL DISC ARTHROPLASTY N/A 05/31/2012   Procedure: CERVICAL ANTERIOR DISC ARTHROPLASTY;  Surgeon: Charlie Pitter, MD;  Location: [place] [facility] ORS;  Service: Neurosurgery;  Laterality: N/A;  C-Arm  . CHOLECYSTECTOMY  07  . gastic  06   bypass  . gastric surgeyr      for intestinal blockage due to pouch rupturing   . gastricsurgery  10   repair intestinal rupture  . hardware removal from left ankle  2015  . SINUS EXPLORATION  00  . TONSILLECTOMY  74  . TOTAL HIP ARTHROPLASTY Right 07/15/2013   Procedure: RIGHT TOTAL HIP ARTHROPLASTY ANTERIOR APPROACH;  Surgeon: Mauri Pole, MD;  Location: [facility] ORS;  Service: Orthopedics;  Laterality: Right;  . TOTAL HIP ARTHROPLASTY Left 09/14/2015   Procedure: LEFT TOTAL HIP ARTHROPLASTY ANTERIOR APPROACH;  Surgeon: Paralee Cancel, MD;  Location: [facility] ORS;  Service: Orthopedics;  Laterality: Left;    OB History    No data available       Home Medications    Prior to Admission medications   Medication Sig Start Date End Date Taking? Authorizing Provider  acetaminophen (TYLENOL) 500  MG tablet Take 2 tablets (1,000 mg total) by mouth every 8 (eight) hours. Patient taking differently: Take 500 mg by mouth every 8 (eight) hours as needed for mild pain.  09/15/15  Yes Danae Orleans, PA-C  ALPRAZolam Duanne Moron) 0.5 MG tablet Take 0.25-0.5 mg by mouth daily as needed for anxiety.    Yes Historical Provider, MD  amitriptyline (ELAVIL) 25 MG tablet Take 25 mg by mouth at bedtime as needed for sleep.  08/06/15  Yes Historical Provider, MD  bisacodyl (DULCOLAX) 5 MG EC tablet Take 5 mg by mouth daily as needed for mild constipation.    Yes Historical Provider, MD  Calcium Carb-Cholecalciferol (CALCIUM 600+D3) 600-800 MG-UNIT TABS Take 1 tablet by mouth daily.   Yes Historical Provider, MD  cefdinir (OMNICEF) 300 MG capsule Take 300 mg by mouth 2 (two) times daily.   Yes Historical Provider, MD  Cyanocobalamin (VITAMIN B-12 SL) Place 2,400 mcg  under the tongue daily.   Yes Historical Provider, MD  diclofenac sodium (VOLTAREN) 1 % GEL Apply 2 g topically at bedtime.  03/05/15  Yes Historical Provider, MD  docusate sodium (COLACE) 100 MG capsule Take 1 capsule (100 mg total) by mouth 2 (two) times daily. Patient taking differently: Take 100 mg by mouth 2 (two) times daily as needed for mild constipation.  09/15/15  Yes Matthew Babish, PA-C  estrogens-methylTEST (ESTRATEST) 1.25-2.5 MG tablet Take 1 tablet by mouth every evening.   Yes Historical Provider, MD  fluticasone (FLONASE) 50 MCG/ACT nasal spray Place 1-2 sprays into both nostrils daily as needed. For allergies. 09/21/15  Yes Historical Provider, MD  HYDROcodone-acetaminophen (NORCO) 10-325 MG tablet Take 1 tablet by mouth every 6 (six) hours as needed for moderate pain.  10/02/15  Yes Historical Provider, MD  magnesium chloride (SLOW-MAG) 64 MG TBEC Take 1 tablet by mouth daily.   Yes Historical Provider, MD  magnesium hydroxide (MILK OF MAGNESIA) 400 MG/5ML suspension Take 30 mLs by mouth daily as needed for mild constipation.   Yes Historical Provider, MD  methocarbamol (ROBAXIN) 750 MG tablet Take 1 tablet (750 mg total) by mouth every 6 (six) hours as needed for muscle spasms. 09/15/15  Yes Danae Orleans, PA-C  Multiple Vitamin (MULTIVITAMIN WITH MINERALS) TABS Take 1 tablet by mouth daily after lunch. Chewable.   Yes Historical Provider, MD  NARCAN 4 MG/0.1ML LIQD Place 0.4 mg into the nose as needed (For overdose.).  10/15/15  Yes Historical Provider, MD  ondansetron (ZOFRAN) 4 MG tablet Take 4 mg by mouth every 8 (eight) hours as needed for nausea.   Yes Historical Provider, MD  oxyCODONE 10 MG TABS Take 1-2 tablets (10-20 mg total) by mouth every 3 (three) hours as needed for severe pain. Patient taking differently: Take 20-30 mg by mouth every 4 (four) hours as needed (For pain.).  09/15/15  Yes Danae Orleans, PA-C  pantoprazole (PROTONIX) 40 MG tablet Take 40 mg by mouth 2 (two)  times daily as needed (acid reflux).  03/25/15 03/24/16 Yes Historical Provider, MD  polyethylene glycol (MIRALAX / GLYCOLAX) packet Take 17 g by mouth 2 (two) times daily. Patient taking differently: Take 17 g by mouth 2 (two) times daily as needed for mild constipation.  09/15/15  Yes Matthew Babish, PA-C  RA ALLERGY RELIEF 10 MG tablet Take 10 mg by mouth daily as needed. For allergies. 09/21/15  Yes Historical Provider, MD  ranitidine (ZANTAC) 150 MG tablet Take 150 mg by mouth 2 (two) times daily as needed for heartburn.  Yes Historical Provider, MD  Simethicone (GAS-X PO) Take 180 mg by mouth daily as needed (flatulence).    Yes Historical Provider, MD  traZODone (DESYREL) 50 MG tablet Take 50 mg by mouth at bedtime as needed for sleep.  09/01/15  Yes Historical Provider, MD  vitamin C (ASCORBIC ACID) 500 MG tablet Take 1,000 mg by mouth every morning.    Yes Historical Provider, MD  rivaroxaban (XARELTO) 10 MG TABS tablet Take 1 tablet (10 mg total) by mouth daily. Patient not taking: Reported on 12/15/2015 09/15/15   Danae Orleans, PA-C    Family History No family history on file.  Social History Social History  Substance Use Topics  . Smoking status: Never Smoker  . Smokeless tobacco: Never Used  . Alcohol use 3.4 oz/week    4 Glasses of wine, 2 Standard drinks or equivalent per week     Comment: occ     Allergies   Cymbalta [duloxetine hcl]; Gabapentin; Other; Sulfa antibiotics; Zanaflex [tizanidine hcl]; Adhesive [tape]; Avelox [moxifloxacin]; Silvadene [silver sulfadiazine]; Sulfamethoxazole; and Tapentadol   Review of Systems Review of Systems  Constitutional: Positive for chills and fatigue. Negative for fever.  HENT: Negative for sore throat.   Eyes: Negative for visual disturbance.  Respiratory: Negative for cough and shortness of breath.   Cardiovascular: Negative for chest pain.  Gastrointestinal: Positive for abdominal pain, constipation and nausea. Negative for  diarrhea and vomiting.  Genitourinary: Positive for difficulty urinating, flank pain and frequency (and hesitency). Negative for dysuria, vaginal bleeding and vaginal discharge.  Musculoskeletal: Negative for back pain and neck pain.  Skin: Negative for rash.  Neurological: Negative for syncope and headaches.     Physical Exam Updated Vital Signs BP 159/84 (BP Location: Right Arm)   Pulse (!) 54   Temp 98.6 F (37 C) (Oral)   Resp 18   SpO2 99%   Physical Exam  Constitutional: She is oriented to person, place, and time. She appears well-developed and well-nourished. No distress.  HENT:  Head: Normocephalic and atraumatic.  Eyes: Conjunctivae and EOM are normal.  Neck: Normal range of motion.  Cardiovascular: Normal rate, regular rhythm, normal heart sounds and intact distal pulses.  Exam reveals no gallop and no friction rub.   No murmur heard. Pulmonary/Chest: Effort normal and breath sounds normal. No respiratory distress. She has no wheezes. She has no rales.  Abdominal: Soft. She exhibits no distension. There is tenderness in the suprapubic area and left lower quadrant. There is CVA tenderness (bilateral). There is no guarding.  Musculoskeletal: She exhibits no edema or tenderness.  Neurological: She is alert and oriented to person, place, and time.  Skin: Skin is warm and dry. No rash noted. She is not diaphoretic. No erythema.  Nursing note and vitals reviewed.    ED Treatments / Results  Labs (all labs ordered are listed, but only abnormal results are displayed) Labs Reviewed  URINALYSIS, ROUTINE W REFLEX MICROSCOPIC (NOT AT [facility]) - Abnormal; Notable for the following:       Result Value   Color, Urine ORANGE (*)    Nitrite POSITIVE (*)    All other components within normal limits  URINE MICROSCOPIC-ADD ON - Abnormal; Notable for the following:    Squamous Epithelial / LPF 0-5 (*)    Bacteria, UA RARE (*)    All other components within normal limits  URINE  CULTURE  CBC WITH DIFFERENTIAL/PLATELET  COMPREHENSIVE METABOLIC PANEL  LIPASE, BLOOD    EKG  EKG Interpretation None  Radiology Ct Abdomen Pelvis W Contrast  Result Date: 12/15/2015 CLINICAL DATA:  BILATERAL flank pain today, recent diagnosis of UTI, on second antibiotic EXAM: CT ABDOMEN AND PELVIS WITH CONTRAST TECHNIQUE: Multidetector CT imaging of the abdomen and pelvis was performed using the standard protocol following bolus administration of intravenous contrast. Sagittal and coronal MPR images reconstructed from axial data set. CONTRAST:  114mL ISOVUE-300 IOPAMIDOL (ISOVUE-300) INJECTION 61%, 75mL ISOVUE-300 IOPAMIDOL (ISOVUE-300) INJECTION 61% COMPARISON:  06/17/2012 FINDINGS: Lower chest: Lung bases clear. Hepatobiliary: Gallbladder surgically absent. Liver normal appearance. Pancreas: Normal appearance Spleen: Normal appearance Adrenals/Urinary Tract: Adrenal glands normal appearance. Tiny nonobstructing RIGHT renal calculus calculi. Small cyst at inferior pole LEFT kidney decreased in size since previous exam, now 1.8 x 1.1 cm, previously 3.6 x 2.3 cm. Ureters and visualized bladder normal appearance ; beam hardening artifacts in pelvis obscure portions of bladder. Stomach/Bowel: Appendix surgically absent by history. Prior gastric resection. Large and small bowel loops unremarkable. Vascular/Lymphatic: Scattered normal sized mesenteric nodes without abdominal or pelvic adenopathy. Vascular structures unremarkable. Scattered pelvic phleboliths. Reproductive: Uterus surgically absent with nonvisualization of ovaries. Other: No mass, free air, free fluid or inflammatory process. No hernia. Musculoskeletal: BILATERAL hip prostheses with beam hardening artifacts in pelvis. No acute osseous findings. IMPRESSION: Post BILATERAL total hip arthroplasty. Nonobstructing renal calculi. Interval decrease in size of small LEFT renal cyst. No acute intra-abdominal or intrapelvic abnormalities.  Electronically Signed   By: Lavonia Dana M.D.   On: 12/15/2015 14:13    Procedures Procedures (including critical care time)  Medications Ordered in ED Medications  sodium chloride 0.9 % bolus 1,000 mL (0 mLs Intravenous Stopped 12/15/15 1329)  fentaNYL (SUBLIMAZE) injection 50 mcg (50 mcg Intravenous Given 12/15/15 1240)  ondansetron (ZOFRAN) injection 4 mg (4 mg Intravenous Given 12/15/15 1240)  iopamidol (ISOVUE-300) 61 % injection 30 mL (30 mLs Oral Contrast Given 12/15/15 1351)  iopamidol (ISOVUE-300) 61 % injection 100 mL (100 mLs Intravenous Contrast Given 12/15/15 1351)  oxyCODONE (Oxy IR/ROXICODONE) immediate release tablet 10 mg (10 mg Oral Given 12/15/15 1423)     Initial Impression / Assessment and Plan / ED Course  I have reviewed the triage vital signs and the nursing notes.  Pertinent labs & imaging results that were available during my care of the patient were reviewed by me and considered in my medical decision making (see chart for details).  Clinical Course   55 year old female with a history of gastric bypass, fibromyalgia, multiple surgeries, chronic pain, PTSD presents with concern for suprapubic abdominal pain, pain in her bilateral kidneys, and difficulty urinating. Patient was diagnosed with UTI at [facility] and started on Cipro, however had worsening symptoms, chills, was started on Cefdinir on Monday. Urinalysis is positive for nitrites which may be secondary to azo, however given symptoms described and pain, will order CT to evaluate for other complications or signs of diverticulitis or other acute pathology as cause of symptoms.   CT shows no acute abnormalities, including no diverticulitis, no nephrolithiasis, no radiologic signs of polynephritis or bladder wall thickening. Patient has no leukocytosis. Initial tachycardia likely secondary to anxiety/pain and improved.  Patient's urinalysis shows positive nitrates which are likely secondary to Azo, no white blood cells,  and rare bacteria, and have low suspicion for failure of outpatient UTI treatment based off of vital signs, labs, and CT.  Pt may have UTI that is being treated or possible interstitial cystitis.  Patient is tolerating by mouth, and is appropriate for continued outpatient treatment of possible UTI  with cefdinir. Her urine culture is pending with her PCP, and recommend follow-up with PCP for further care. Patient discharged in stable condition with understanding of reasons to return.   Final Clinical Impressions(s) / ED Diagnoses   Final diagnoses:  Flank pain  Urinary tract infection symptoms    New Prescriptions Discharge Medication List as of 12/15/2015  3:11 PM       Gareth Morgan, MD 12/15/15 1758

## 2015-12-15 NOTE — ED Notes (Signed)
Patient states she took pain meds at 1130-requesting more of her home pain med-encouraged patient to wait till MD assesses her

## 2015-12-15 NOTE — ED Notes (Signed)
Off floor for testing 

## 2015-12-16 LAB — URINE CULTURE: CULTURE: NO GROWTH

## 2016-07-25 IMAGING — CR DG HIP (WITH OR WITHOUT PELVIS) 2-3V*L*
3 series · 3 of 3 positions shown · non-contrast
Comparison: Pelvic radiograph [DATE]

CLINICAL DATA: Slipped sensation LEFT hip, severe LEFT hip pain.
Status post hip replacement [REDACTED].

EXAM:
DG HIP (WITH OR WITHOUT PELVIS) 2-3V LEFT

[t pelvis ap]
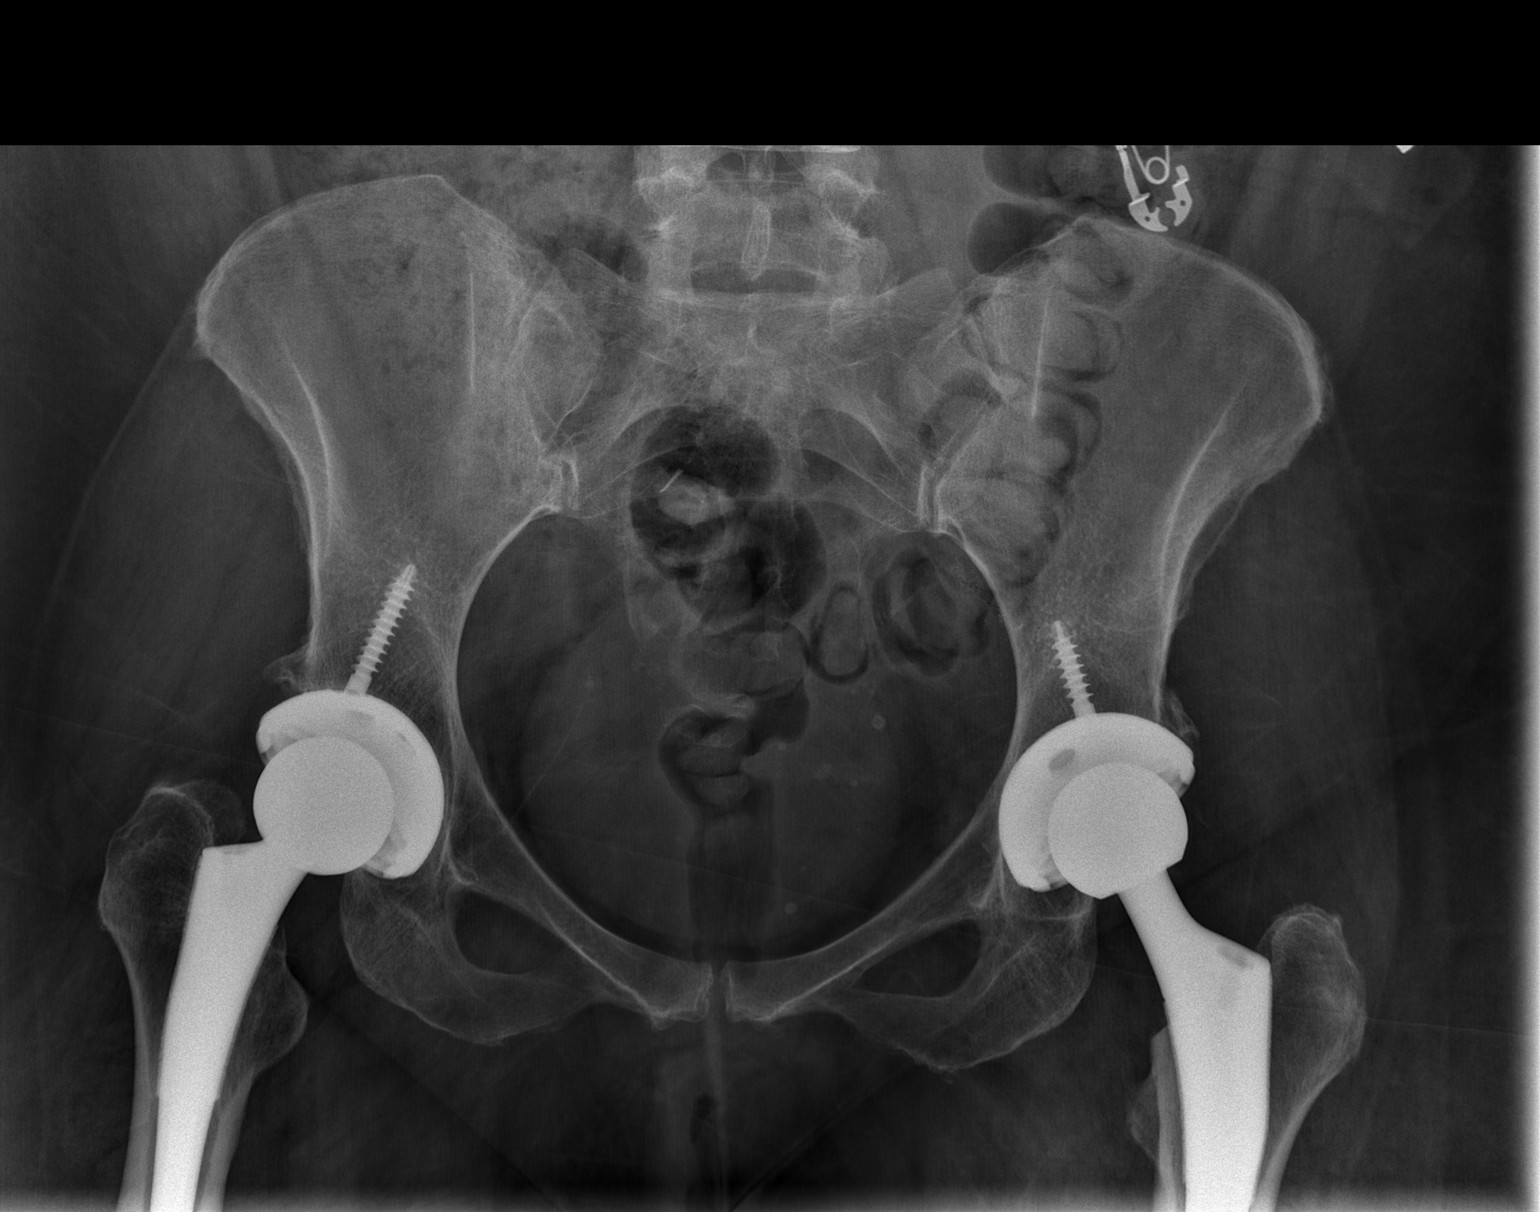

[t hip ap left]
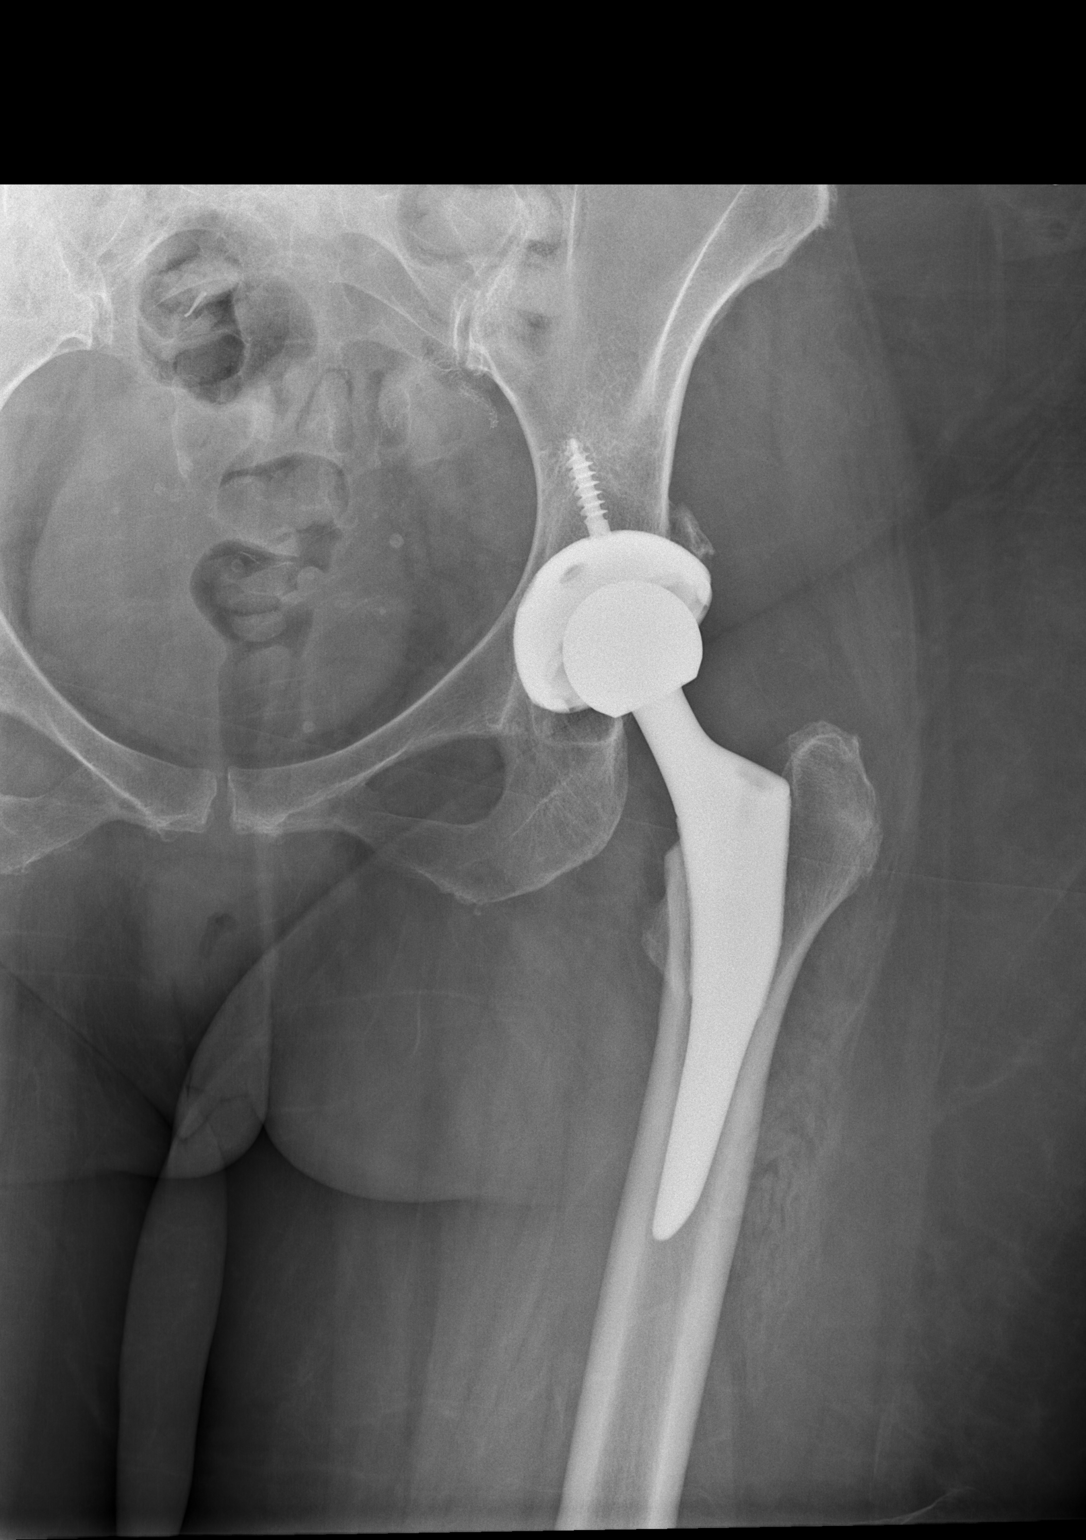

[t hip frog leg left]
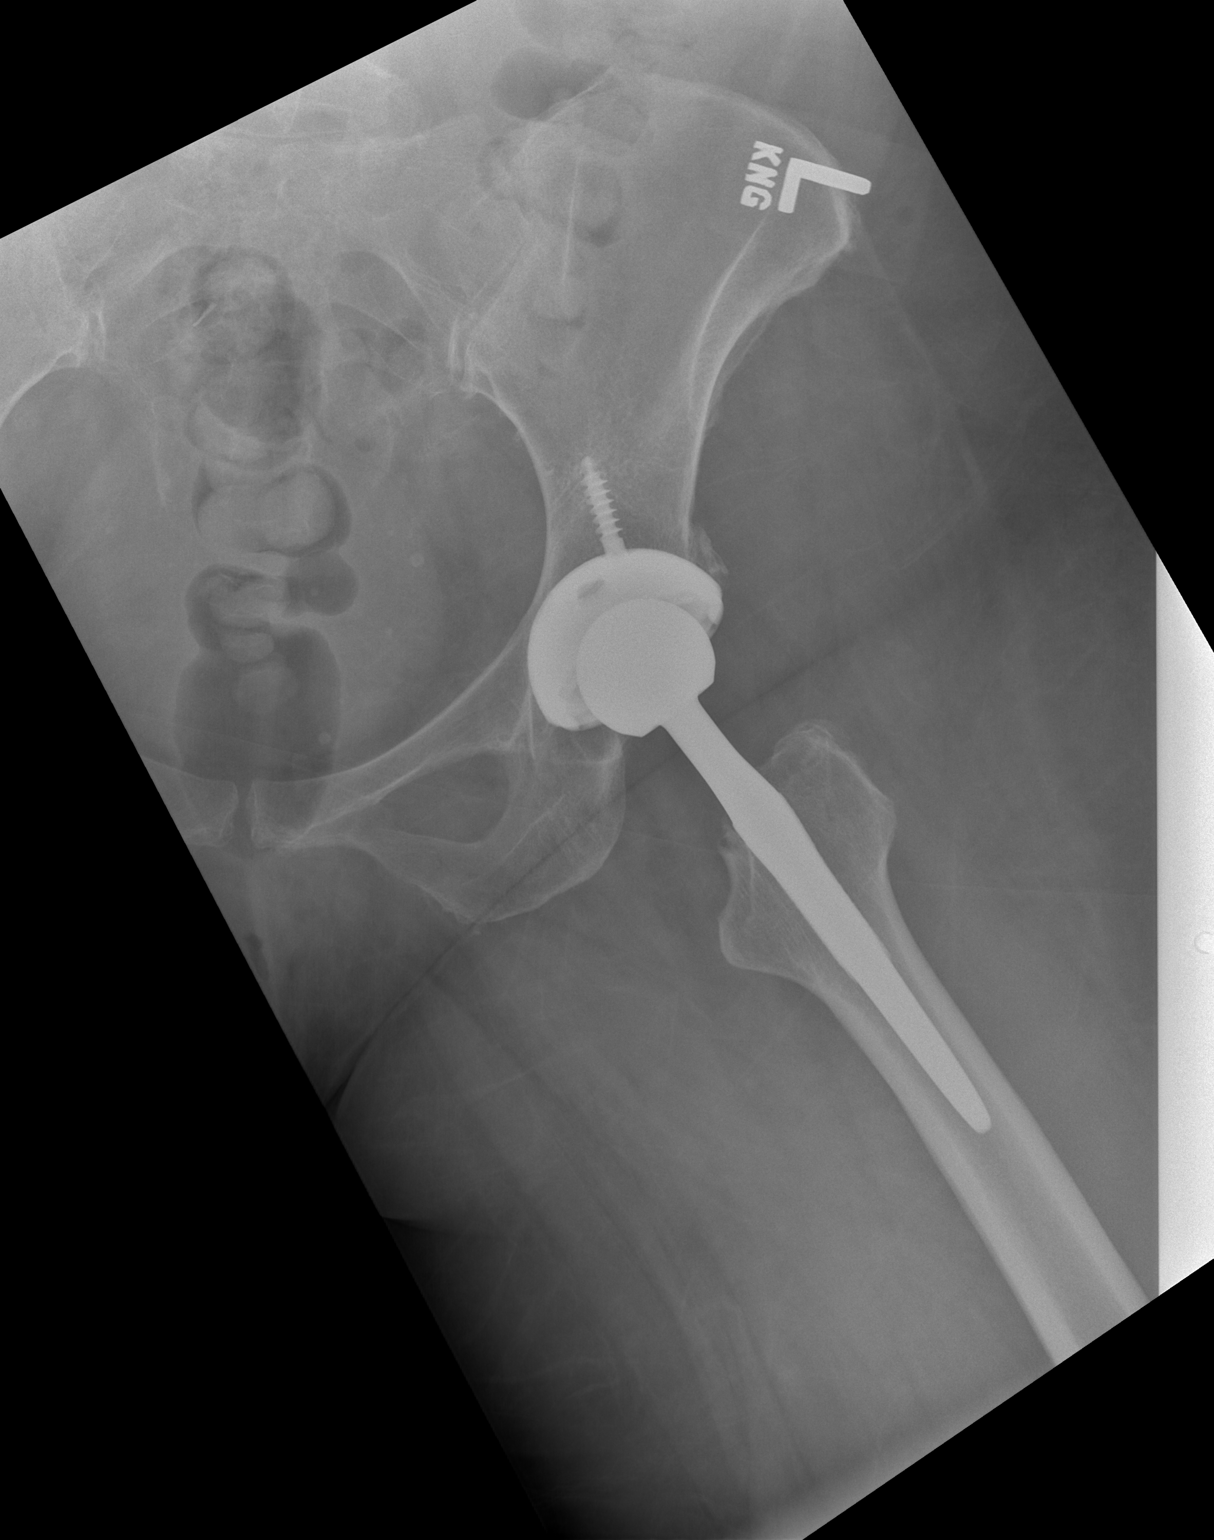

[3 of 3 positions shown; findings below may reference images not displayed]

FINDINGS: Status post bilateral hip total arthroplasties with intact
well-seated femoral and acetabular components. No periprosthetic
lucency. No destructive bony lesions. Phleboliths project in the
pelvis. Mild vascular calcifications. Resolution of LEFT hip
subcutaneous gas.
IMPRESSION: No acute fracture deformity or dislocation. Status post bilateral
hip total arthroplasties without radiographic findings of hardware
failure.

## 2023-07-12 ENCOUNTER — Emergency Department (HOSPITAL_COMMUNITY)
Admission: EM | Admit: 2023-07-12 | Discharge: 2023-07-12 | Disposition: A | Discharge status: Home / Self Care | Attending: Emergency Medicine | Admitting: Emergency Medicine

## 2023-07-12 ENCOUNTER — Other Ambulatory Visit: Payer: Self-pay

## 2023-07-12 ENCOUNTER — Emergency Department (HOSPITAL_COMMUNITY)

## 2023-07-12 DIAGNOSIS — M542 Cervicalgia: Secondary | ICD-10-CM | POA: Insufficient documentation

## 2023-07-12 DIAGNOSIS — R519 Headache, unspecified: Secondary | ICD-10-CM | POA: Diagnosis present

## 2023-07-12 DIAGNOSIS — Z7901 Long term (current) use of anticoagulants: Secondary | ICD-10-CM | POA: Diagnosis not present

## 2023-07-12 DIAGNOSIS — Z79899 Other long term (current) drug therapy: Secondary | ICD-10-CM | POA: Insufficient documentation

## 2023-07-12 DIAGNOSIS — I10 Essential (primary) hypertension: Secondary | ICD-10-CM | POA: Insufficient documentation

## 2023-07-12 LAB — CBC WITH DIFFERENTIAL/PLATELET
Abs Immature Granulocytes: 0.01 10*3/uL (ref 0.00–0.07)
Basophils Absolute: 0.1 10*3/uL (ref 0.0–0.1)
Basophils Relative: 1 %
Eosinophils Absolute: 0.1 10*3/uL (ref 0.0–0.5)
Eosinophils Relative: 2 %
HCT: 37.9 % (ref 36.0–46.0)
Hemoglobin: 12.1 g/dL (ref 12.0–15.0)
Immature Granulocytes: 0 %
Lymphocytes Relative: 39 %
Lymphs Abs: 2.6 10*3/uL (ref 0.7–4.0)
MCH: 28.5 pg (ref 26.0–34.0)
MCHC: 31.9 g/dL (ref 30.0–36.0)
MCV: 89.4 fL (ref 80.0–100.0)
Monocytes Absolute: 0.7 10*3/uL (ref 0.1–1.0)
Monocytes Relative: 11 %
Neutro Abs: 3.1 10*3/uL (ref 1.7–7.7)
Neutrophils Relative %: 47 %
Platelets: 221 10*3/uL (ref 150–400)
RBC: 4.24 MIL/uL (ref 3.87–5.11)
RDW: 16.6 % — ABNORMAL HIGH (ref 11.5–15.5)
WBC: 6.6 10*3/uL (ref 4.0–10.5)
nRBC: 0 % (ref 0.0–0.2)

## 2023-07-12 LAB — COMPREHENSIVE METABOLIC PANEL WITH GFR
ALT: 20 U/L (ref 0–44)
AST: 31 U/L (ref 15–41)
Albumin: 3.8 g/dL (ref 3.5–5.0)
Alkaline Phosphatase: 41 U/L (ref 38–126)
Anion gap: 8 (ref 5–15)
BUN: 17 mg/dL (ref 8–23)
CO2: 27 mmol/L (ref 22–32)
Calcium: 8.6 mg/dL — ABNORMAL LOW (ref 8.9–10.3)
Chloride: 100 mmol/L (ref 98–111)
Creatinine, Ser: 0.58 mg/dL (ref 0.44–1.00)
GFR, Estimated: >60 mL/min (ref >60)
Glucose, Bld: 111 mg/dL — ABNORMAL HIGH (ref 70–99)
Potassium: 4 mmol/L (ref 3.5–5.1)
Sodium: 135 mmol/L (ref 135–145)
Total Bilirubin: 0.6 mg/dL (ref 0.0–1.2)
Total Protein: 6.7 g/dL (ref 6.5–8.1)

## 2023-07-12 MED ORDER — AMLODIPINE BESYLATE 5 MG PO TABS: 5.0000 mg | ORAL_TABLET | Freq: Every day | ORAL | 0 refills | Status: AC

## 2023-07-12 MED ORDER — PROCHLORPERAZINE EDISYLATE 10 MG/2ML IJ SOLN
10.0000 mg | Freq: Once | INTRAMUSCULAR | Status: AC
  Administered 2023-07-12: 10 mg via INTRAVENOUS
  Filled 2023-07-12: qty 2

## 2023-07-12 MED ORDER — OXYMETAZOLINE HCL 0.05 % NA SOLN
1.0000 | Freq: Once | NASAL | Status: AC
  Administered 2023-07-12: 1 via NASAL
  Filled 2023-07-12: qty 30

## 2023-07-12 MED ORDER — KETOROLAC TROMETHAMINE 15 MG/ML IJ SOLN
15.0000 mg | Freq: Once | INTRAMUSCULAR | Status: AC
  Administered 2023-07-12: 15 mg via INTRAVENOUS
  Filled 2023-07-12: qty 1

## 2023-07-12 MED ORDER — DIPHENHYDRAMINE HCL 50 MG/ML IJ SOLN
12.5000 mg | Freq: Once | INTRAMUSCULAR | Status: AC
  Administered 2023-07-12: 12.5 mg via INTRAVENOUS
  Filled 2023-07-12: qty 1

## 2023-07-12 MED ORDER — SODIUM CHLORIDE 0.9 % IV BOLUS
1000.0000 mL | Freq: Once | INTRAVENOUS | Status: AC
  Administered 2023-07-12: 1000 mL via INTRAVENOUS

## 2023-07-12 NOTE — Discharge Instructions (Signed)

## 2023-07-12 NOTE — ED Triage Notes (Signed)
 Pt complaining of severe neck pain. Pt's husband states that she has had several falls. Pt does not remember falling as many times. Pt appears to be alert and oriented. BP is 208/94. Pt is complaining of head pain from when she recently hit her head on a tv tray.

## 2023-07-12 NOTE — ED Provider Notes (Signed)
 [place] [facility] AT [facility] Provider Note   CSN: [id number] Arrival date & time: 07/12/23  1716     History  Chief Complaint  Patient presents with   Neck Injury   HPI Janet Potts is a 63 y.o. female with history of cervical anterior disc arthroplasty in 2014, chronic pain on Suboxone, fibromyalgia presenting for head injury.  Her husband states that she has had at least 3 falls in the last week.  Most recently she was sitting at the counter eating cereal when she fell forward and struck her forehead on the marble countertop.  She also reports that she was "sleepwalking" a couple days ago and fell forward striking her head as well.  She states that she always has "10/10 anterior neck pain" since her surgery in 2014.  States this is why she is on Suboxone.  She reports now that she has some pain in the back of her neck since her most recent fall.  Denies any associated syncope or near syncope symptoms.  Her husband is also noted that her blood pressure has been slightly more elevated and systolics have been in the 200s which is unusual.  She denies any chest pain shortness of breath, urinary symptoms.  Also reports intermittent visual disturbance.  But states her eyesight in this moment is "completely normal".  She is also states that for the past 2 days she has had a "really bad headache".  Denies thunderclap symptoms and was gradual in onset.  States the pain is a bandlike distribution about her forehead.  Also mentions that she has been in a "great deal of stress" with her family.  She denies SI, HI and hallucinations.   Neck Injury       Home Medications Prior to Admission medications   Medication Sig Start Date End Date Taking? Authorizing Provider  amLODipine (NORVASC) 5 MG tablet Take 1 tablet (5 mg total) by mouth daily. 07/12/23 08/11/23 Yes Gareth Eagle, PA-C  acetaminophen (TYLENOL) 500 MG tablet Take 2 tablets (1,000 mg total) by mouth every 8  (eight) hours. Patient taking differently: Take 500 mg by mouth every 8 (eight) hours as needed for mild pain.  09/15/15   Lanney Gins, PA-C  ALPRAZolam Prudy Feeler) 0.5 MG tablet Take 0.25-0.5 mg by mouth daily as needed for anxiety.     [provider]  amitriptyline (ELAVIL) 25 MG tablet Take 25 mg by mouth at bedtime as needed for sleep.  08/06/15   [provider]  bisacodyl (DULCOLAX) 5 MG EC tablet Take 5 mg by mouth daily as needed for mild constipation.     [provider]  Calcium Carb-Cholecalciferol (CALCIUM 600+D3) 600-800 MG-UNIT TABS Take 1 tablet by mouth daily.    [provider]  cefdinir (OMNICEF) 300 MG capsule Take 300 mg by mouth 2 (two) times daily.    [provider]  Cyanocobalamin (VITAMIN B-12 SL) Place 2,400 mcg under the tongue daily.    [provider]  diclofenac sodium (VOLTAREN) 1 % GEL Apply 2 g topically at bedtime.  03/05/15   [provider]  docusate sodium (COLACE) 100 MG capsule Take 1 capsule (100 mg total) by mouth 2 (two) times daily. Patient taking differently: Take 100 mg by mouth 2 (two) times daily as needed for mild constipation.  09/15/15   Lanney Gins, PA-C  estrogens-methylTEST (ESTRATEST) 1.25-2.5 MG tablet Take 1 tablet by mouth every evening.    [provider]  fluticasone Aleda Grana)  50 MCG/ACT nasal spray Place 1-2 sprays into both nostrils daily as needed. For allergies. 09/21/15   [provider]  HYDROcodone-acetaminophen (NORCO) 10-325 MG tablet Take 1 tablet by mouth every 6 (six) hours as needed for moderate pain.  10/02/15   [provider]  magnesium chloride (SLOW-MAG) 64 MG TBEC Take 1 tablet by mouth daily.    [provider]  magnesium hydroxide (MILK OF MAGNESIA) 400 MG/5ML suspension Take 30 mLs by mouth daily as needed for mild constipation.    [provider]  methocarbamol (ROBAXIN) 750 MG tablet Take 1 tablet (750 mg total) by  mouth every 6 (six) hours as needed for muscle spasms. 09/15/15   Lanney Gins, PA-C  Multiple Vitamin (MULTIVITAMIN WITH MINERALS) TABS Take 1 tablet by mouth daily after lunch. Chewable.    [provider]  NARCAN 4 MG/0.1ML LIQD Place 0.4 mg into the nose as needed (For overdose.).  10/15/15   [provider]  ondansetron (ZOFRAN) 4 MG tablet Take 4 mg by mouth every 8 (eight) hours as needed for nausea.    [provider]  oxyCODONE 10 MG TABS Take 1-2 tablets (10-20 mg total) by mouth every 3 (three) hours as needed for severe pain. Patient taking differently: Take 20-30 mg by mouth every 4 (four) hours as needed (For pain.).  09/15/15   Lanney Gins, PA-C  pantoprazole (PROTONIX) 40 MG tablet Take 40 mg by mouth 2 (two) times daily as needed (acid reflux).  03/25/15 03/24/16  [provider]  polyethylene glycol (MIRALAX / GLYCOLAX) packet Take 17 g by mouth 2 (two) times daily. Patient taking differently: Take 17 g by mouth 2 (two) times daily as needed for mild constipation.  09/15/15   Lanney Gins, PA-C  RA ALLERGY RELIEF 10 MG tablet Take 10 mg by mouth daily as needed. For allergies. 09/21/15   [provider]  ranitidine (ZANTAC) 150 MG tablet Take 150 mg by mouth 2 (two) times daily as needed for heartburn.    [provider]  rivaroxaban (XARELTO) 10 MG TABS tablet Take 1 tablet (10 mg total) by mouth daily. Patient not taking: Reported on 12/15/2015 09/15/15   Lanney Gins, PA-C  Simethicone (GAS-X PO) Take 180 mg by mouth daily as needed (flatulence).     [provider]  traZODone (DESYREL) 50 MG tablet Take 50 mg by mouth at bedtime as needed for sleep.  09/01/15   [provider]  vitamin C (ASCORBIC ACID) 500 MG tablet Take 1,000 mg by mouth every morning.     [provider]      Allergies    Cymbalta [duloxetine hcl], Gabapentin, Other, Sulfa antibiotics, Zanaflex [tizanidine hcl], Adhesive  [tape], Avelox [moxifloxacin], Silvadene [silver sulfadiazine], Sulfamethoxazole, and Tapentadol    Review of Systems   See HPI   Physical Exam   Vitals:   07/12/23 1830 07/12/23 2125  BP: (!) 169/94   Pulse: (!) 57   Resp: 18   Temp:  97.9 F (36.6 C)  SpO2: 100%     CONSTITUTIONAL:  Well-appearing, NAD NEURO:  GCS 15. Speech is goal oriented. No deficits appreciated to CN III-XII; symmetric eyebrow raise, no facial drooping, tongue midline. Patient has equal grip strength bilaterally with 5/5 strength against resistance in all major muscle groups bilaterally. Sensation to light touch intact. Patient moves extremities without ataxia. Normal finger-nose-finger. Patient ambulatory with steady gait. Head: No Battle sign, raccoon eyes or rhinorrhea EYES:  eyes equal and reactive  ENT/NECK:  Supple, no stridor, the neck is atraumatic, no bruits, no significant swelling or edema or fluctuance CARDIO:  Regular rate and  rhythm, appears well-perfused  PULM:  No respiratory distress soft, CTAB GI/GU:  non-distended MSK/SPINE:  No gross deformities, no edema, moves all extremities  SKIN:  no rash, atraumatic   *Additional and/or pertinent findings included in MDM below     ED Results / Procedures / Treatments   Labs (all labs ordered are listed, but only abnormal results are displayed) Labs Reviewed  CBC WITH DIFFERENTIAL/PLATELET - Abnormal; Notable for the following components:      Result Value   RDW 16.6 (*)    All other components within normal limits  COMPREHENSIVE METABOLIC PANEL WITH GFR - Abnormal; Notable for the following components:   Glucose, Bld 111 (*)    Calcium 8.6 (*)    All other components within normal limits    EKG None  Radiology CT HEAD WO CONTRAST Result Date: 07/12/2023 CLINICAL DATA:  Polytrauma, blunt EXAM: CT HEAD WITHOUT CONTRAST CT CERVICAL SPINE WITHOUT CONTRAST TECHNIQUE: Multidetector CT imaging of the head and cervical spine was performed  following the standard protocol without intravenous contrast. Multiplanar CT image reconstructions of the cervical spine were also generated. RADIATION DOSE REDUCTION: This exam was performed according to the departmental dose-optimization program which includes automated exposure control, adjustment of the mA and/or kV according to patient size and/or use of iterative reconstruction technique. COMPARISON:  CT neck 09/19/2015, MRI cervical spine 03/07/2015 FINDINGS: CT HEAD FINDINGS Brain: No evidence of large-territorial acute infarction. No parenchymal hemorrhage. No mass lesion. No extra-axial collection. No mass effect or midline shift. No hydrocephalus. Basilar cisterns are patent. Vascular: No hyperdense vessel. Skull: No acute fracture or focal lesion. Sinuses/Orbits: Paranasal sinuses and mastoid air cells are clear. The orbits are unremarkable. Other: None. CT CERVICAL SPINE FINDINGS Alignment: Normal. Skull base and vertebrae: Interbody surgical hardware at the C4-C5 level. Anterior cervical discectomy and fusion surgical hardware at the C6-C7 level. Multilevel mild degenerative changes of the spine. No severe osseous neural foraminal or central canal stenosis. No acute fracture. No aggressive appearing focal osseous lesion or focal pathologic process. Soft tissues and spinal canal: No prevertebral fluid or swelling. No visible canal hematoma.Neural stimulator leads terminate within the posterior central canal at the level of the C3 through C7 levels. Upper chest: Unremarkable. Other: Atherosclerotic plaque of the aortic arch and its main branches. IMPRESSION: 1. No acute intracranial abnormality. 2. No acute displaced fracture or traumatic listhesis of the cervical spine. Electronically Signed   By: Tish Frederickson M.D.   On: 07/12/2023 21:20   CT Cervical Spine Wo Contrast Result Date: 07/12/2023 CLINICAL DATA:  Polytrauma, blunt EXAM: CT HEAD WITHOUT CONTRAST CT CERVICAL SPINE WITHOUT CONTRAST  TECHNIQUE: Multidetector CT imaging of the head and cervical spine was performed following the standard protocol without intravenous contrast. Multiplanar CT image reconstructions of the cervical spine were also generated. RADIATION DOSE REDUCTION: This exam was performed according to the departmental dose-optimization program which includes automated exposure control, adjustment of the mA and/or kV according to patient size and/or use of iterative reconstruction technique. COMPARISON:  CT neck 09/19/2015, MRI cervical spine 03/07/2015 FINDINGS: CT HEAD FINDINGS Brain: No evidence of large-territorial acute infarction. No parenchymal hemorrhage. No mass lesion. No extra-axial collection. No mass effect or midline shift. No hydrocephalus. Basilar cisterns are patent. Vascular: No hyperdense vessel. Skull: No acute fracture or focal lesion. Sinuses/Orbits: Paranasal sinuses and mastoid air cells  are clear. The orbits are unremarkable. Other: None. CT CERVICAL SPINE FINDINGS Alignment: Normal. Skull base and vertebrae: Interbody surgical hardware at the C4-C5 level. Anterior cervical discectomy and fusion surgical hardware at the C6-C7 level. Multilevel mild degenerative changes of the spine. No severe osseous neural foraminal or central canal stenosis. No acute fracture. No aggressive appearing focal osseous lesion or focal pathologic process. Soft tissues and spinal canal: No prevertebral fluid or swelling. No visible canal hematoma.Neural stimulator leads terminate within the posterior central canal at the level of the C3 through C7 levels. Upper chest: Unremarkable. Other: Atherosclerotic plaque of the aortic arch and its main branches. IMPRESSION: 1. No acute intracranial abnormality. 2. No acute displaced fracture or traumatic listhesis of the cervical spine. Electronically Signed   By: Tish Frederickson M.D.   On: 07/12/2023 21:20    Procedures Procedures    Medications Ordered in ED Medications   diphenhydrAMINE (BENADRYL) injection 12.5 mg (12.5 mg Intravenous Given 07/12/23 1839)  prochlorperazine (COMPAZINE) injection 10 mg (10 mg Intravenous Given 07/12/23 1844)  sodium chloride 0.9 % bolus 1,000 mL (1,000 mLs Intravenous New Bag/Given 07/12/23 1844)  oxymetazoline (AFRIN) 0.05 % nasal spray 1 spray (1 spray Each Nare Given 07/12/23 2155)  ketorolac (TORADOL) 15 MG/ML injection 15 mg (15 mg Intravenous Given 07/12/23 2155)    ED Course/ Medical Decision Making/ A&P                                 Medical Decision Making Amount and/or Complexity of Data Reviewed Radiology: ordered.  Risk OTC drugs. Prescription drug management.   Initial Impression and Ddx 63 year old well-appearing female presenting for multiple falls, head injury with headache and neck pain.  Exam was largely unremarkable but was notably hypertensive initially.  DDx includes SAH, stroke, meningitis, subdural hematoma, vascular injury of the neck, traumatic injury to the neck, cervical spine fracture, skull fracture, hypertensive emergency, other. Patient PMH that increases complexity of ED encounter:   history of cervical anterior disc arthroplasty in 2014, chronic pain on Suboxone, fibromyalgia   Interpretation of Diagnostics - I independent reviewed and interpreted the labs as followed: no acute derangements  - I independently visualized the following imaging with scope of interpretation limited to determining acute life threatening conditions related to emergency care: CT head and cervical spine, which revealed no acute findings  Patient Reassessment and Ultimate Disposition/Management On reassessment, blood pressure improved and she stated her headache completely resolved.  Expressed a strong desire to be discharged.  Workup is reassuring no acute finding on CT.  Considered meningitis but unlikely given lack of symptoms no fever.  Suspect her neck pain is more chronic in nature given that she says it is a  10/10 all "all the time" and remains unchanged after the fall earlier today.  Per chart review, patient has no history of hypertension.  Blood pressure was markedly elevated here but with improvement after treatment.  Husband also stated she has had some markedly elevated BPs at home.  For this reason I started her on low-dose amlodipine.  Advised her to follow-up with her PCP.  Discussed return cautions.  Workup does not suggest hypertensive emergency.  Discharged good condition.  Patient management required discussion with the following services or consulting groups:  None  Complexity of Problems Addressed Acute complicated illness or Injury  Additional Data Reviewed and Analyzed Further history obtained from: Further history from spouse/family member and Past  medical history and medications listed in the EMR  Patient Encounter Risk Assessment Prescriptions         Final Clinical Impression(s) / ED Diagnoses Final diagnoses:  Uncontrolled hypertension    Rx / DC Orders ED Discharge Orders          Ordered    amLODipine (NORVASC) 5 MG tablet  Daily        07/12/23 2222              Gareth Eagle, PA-C 07/12/23 2222    Benjiman Core, MD 07/12/23 2322
